# Patient Record
Sex: Female | Born: 1990
Health system: Southern US, Community
[De-identification: ages and names within clinical notes are randomized; demographics above are authoritative.]

## PROBLEM LIST (undated history)

## (undated) ENCOUNTER — Inpatient Hospital Stay (HOSPITAL_COMMUNITY): Payer: Self-pay

## (undated) ENCOUNTER — Inpatient Hospital Stay (HOSPITAL_COMMUNITY): Payer: 59

## (undated) DIAGNOSIS — T8859XA Other complications of anesthesia, initial encounter: Secondary | ICD-10-CM

## (undated) DIAGNOSIS — O99019 Anemia complicating pregnancy, unspecified trimester: Secondary | ICD-10-CM

## (undated) DIAGNOSIS — R87810 Cervical high risk human papillomavirus (HPV) DNA test positive: Secondary | ICD-10-CM

## (undated) DIAGNOSIS — IMO0002 Reserved for concepts with insufficient information to code with codable children: Secondary | ICD-10-CM

## (undated) DIAGNOSIS — R87610 Atypical squamous cells of undetermined significance on cytologic smear of cervix (ASC-US): Secondary | ICD-10-CM

## (undated) DIAGNOSIS — T4145XA Adverse effect of unspecified anesthetic, initial encounter: Secondary | ICD-10-CM

## (undated) DIAGNOSIS — I10 Essential (primary) hypertension: Secondary | ICD-10-CM

## (undated) DIAGNOSIS — Z8759 Personal history of other complications of pregnancy, childbirth and the puerperium: Secondary | ICD-10-CM

## (undated) DIAGNOSIS — J45909 Unspecified asthma, uncomplicated: Secondary | ICD-10-CM

## (undated) HISTORY — DX: Adverse effect of unspecified anesthetic, initial encounter: T41.45XA

## (undated) HISTORY — PX: DILATION AND CURETTAGE OF UTERUS: SHX78

## (undated) HISTORY — PX: INDUCED ABORTION: SHX677

## (undated) HISTORY — DX: Anemia complicating pregnancy, unspecified trimester: O99.019

## (undated) HISTORY — DX: Other complications of anesthesia, initial encounter: T88.59XA

## (undated) HISTORY — PX: WISDOM TOOTH EXTRACTION: SHX21

## (undated) HISTORY — DX: Atypical squamous cells of undetermined significance on cytologic smear of cervix (ASC-US): R87.610

## (undated) HISTORY — DX: Personal history of other complications of pregnancy, childbirth and the puerperium: Z87.59

## (undated) HISTORY — DX: Cervical high risk human papillomavirus (HPV) DNA test positive: R87.810

---
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
## 2013-05-16 ENCOUNTER — Encounter: Payer: 59 | Admitting: Nurse Practitioner

## 2013-05-16 ENCOUNTER — Encounter: Payer: Self-pay | Admitting: Nurse Practitioner

## 2013-05-17 NOTE — Progress Notes (Signed)
Patient was not seen today by practitioner.

## 2013-06-29 ENCOUNTER — Inpatient Hospital Stay (HOSPITAL_COMMUNITY): Payer: 59

## 2013-06-29 ENCOUNTER — Encounter (HOSPITAL_COMMUNITY): Payer: Self-pay | Admitting: *Deleted

## 2013-06-29 ENCOUNTER — Inpatient Hospital Stay (HOSPITAL_COMMUNITY)
Admission: AD | Admit: 2013-06-29 | Discharge: 2013-06-29 | Disposition: A | Discharge status: Home / Self Care | Payer: 59 | Source: Ambulatory Visit | Attending: Obstetrics & Gynecology | Admitting: Obstetrics & Gynecology

## 2013-06-29 DIAGNOSIS — R1031 Right lower quadrant pain: Secondary | ICD-10-CM | POA: Insufficient documentation

## 2013-06-29 DIAGNOSIS — IMO0001 Reserved for inherently not codable concepts without codable children: Secondary | ICD-10-CM | POA: Insufficient documentation

## 2013-06-29 DIAGNOSIS — M7918 Myalgia, other site: Secondary | ICD-10-CM

## 2013-06-29 DIAGNOSIS — J9819 Other pulmonary collapse: Secondary | ICD-10-CM | POA: Insufficient documentation

## 2013-06-29 DIAGNOSIS — F17200 Nicotine dependence, unspecified, uncomplicated: Secondary | ICD-10-CM | POA: Insufficient documentation

## 2013-06-29 DIAGNOSIS — Z30431 Encounter for routine checking of intrauterine contraceptive device: Secondary | ICD-10-CM | POA: Insufficient documentation

## 2013-06-29 DIAGNOSIS — K8020 Calculus of gallbladder without cholecystitis without obstruction: Secondary | ICD-10-CM | POA: Insufficient documentation

## 2013-06-29 LAB — CBC
Hemoglobin: 14.5 g/dL (ref 12.0–15.0)
RBC: 4.7 MIL/uL (ref 3.87–5.11)

## 2013-06-29 LAB — POCT PREGNANCY, URINE: Preg Test, Ur: NEGATIVE

## 2013-06-29 LAB — WET PREP, GENITAL
Clue Cells Wet Prep HPF POC: NONE SEEN
Trich, Wet Prep: NONE SEEN

## 2013-06-29 LAB — URINALYSIS, ROUTINE W REFLEX MICROSCOPIC
Leukocytes, UA: NEGATIVE
Nitrite: NEGATIVE
Specific Gravity, Urine: >1.03 — ABNORMAL HIGH (ref 1.005–1.030)
pH: 6 (ref 5.0–8.0)

## 2013-06-29 MED ORDER — PROMETHAZINE HCL 25 MG PO TABS: 12.5000 mg | ORAL_TABLET | Freq: Four times a day (QID) | ORAL | Status: DC | PRN

## 2013-06-29 MED ORDER — CYCLOBENZAPRINE HCL 10 MG PO TABS
10.0000 mg | ORAL_TABLET | ORAL | Status: AC
  Administered 2013-06-29: 10 mg via ORAL
  Filled 2013-06-29: qty 1

## 2013-06-29 MED ORDER — CYCLOBENZAPRINE HCL 10 MG PO TABS: 10.0000 mg | ORAL_TABLET | Freq: Three times a day (TID) | ORAL | Status: DC | PRN

## 2013-06-29 MED ORDER — HYDROMORPHONE HCL PF 2 MG/ML IJ SOLN
2.0000 mg | INTRAMUSCULAR | Status: DC
  Filled 2013-06-29: qty 1

## 2013-06-29 MED ORDER — DIPHENHYDRAMINE HCL 50 MG/ML IJ SOLN
25.0000 mg | INTRAMUSCULAR | Status: AC
  Administered 2013-06-29: 25 mg via INTRAVENOUS
  Filled 2013-06-29: qty 1

## 2013-06-29 MED ORDER — HYDROMORPHONE HCL PF 2 MG/ML IJ SOLN
2.0000 mg | INTRAMUSCULAR | Status: AC
  Administered 2013-06-29: 2 mg via INTRAMUSCULAR

## 2013-06-29 MED ORDER — IBUPROFEN 800 MG PO TABS: 800.0000 mg | ORAL_TABLET | Freq: Three times a day (TID) | ORAL | Status: DC | PRN

## 2013-06-29 MED ORDER — IOHEXOL 300 MG/ML  SOLN
100.0000 mL | Freq: Once | INTRAMUSCULAR | Status: AC | PRN
Start: 2013-06-29 — End: 2013-06-29
  Administered 2013-06-29: 100 mL via INTRAVENOUS

## 2013-06-29 MED ORDER — IOHEXOL 300 MG/ML  SOLN
50.0000 mL | INTRAMUSCULAR | Status: AC
  Administered 2013-06-29: 50 mL via ORAL

## 2013-06-29 NOTE — MAU Note (Signed)
Has mirena, doesn't ever get a period, just spots.  Don't ever feel anything, can't even tell it's there.  Spotting started on Oct 12, severe pain started on Monday in RLQ- is getting worse, tender to touch, can't sleep. Had a fever last night. (100.3), has been throwing up.

## 2013-06-29 NOTE — Progress Notes (Signed)
Pt states she has been sitting on the toilet all day. Pt states she has sat on the toilet for an hour at times

## 2013-06-29 NOTE — MAU Provider Note (Signed)
History    Sydney Henry is a 22 y.o. G2P1011 complaining of 1+ week of abdominal pain that worsened drastically today. It is accompanied by nausea and vomiting; the patient has been able to "hold down" ginger ale PO since about 1200 hrs. The patient's pain started midline / periumbilical and has progressed to the RLQ and she indicates the pain is in the region of McBurney's point when pointing. The patient is exquisitely tender and likens her pain to "the contractions of labor and unlike anything else (she) has ever experienced." The patient says the pain sometimes radiates to her back when she puts pressure on the area or positions herself wrong. The pain is not relieved with position. The patient reports pain is refractory to 800mg  ibuprofen OTC. She states she "had a fever of 103" at home Monday. The patient also endorses "spotting for over a month with no regular period" which is a change. She had a Mirena IUD placed 09/2012.   CSN: [id number]  Arrival date and time: 06/29/13 1541   None     Chief Complaint  Patient presents with  . Pelvic Pain   HPI  OB History   Grav Para Term Preterm Abortions TAB SAB Ect Mult Living   2 1 1  1 1    1       Past Medical History  Diagnosis Date  . Anemia   . Hypertension     2013    Past Surgical History  Procedure Laterality Date  . Induced abortion    . Dilation and curettage of uterus      Family History  Problem Relation Age of Onset  . Hypertension Father   . Diabetes Father   . Diabetes Maternal Aunt   . Hypertension Maternal Aunt   . Cancer Maternal Grandmother   . Diabetes Maternal Grandfather   . Diabetes Paternal Grandmother   . Hypertension Paternal Grandmother   . Cancer Paternal Grandmother     History  Substance Use Topics  . Smoking status: Current Every Day Smoker -- 0.25 packs/day for .5 years    Types: Cigarettes    Last Attempt to Quit: 02/23/2011  . Smokeless tobacco: Never Used  . Alcohol Use: No     Allergies: No Known Allergies  Prescriptions prior to admission  Medication Sig Dispense Refill  . hydrochlorothiazide (HYDRODIURIL) 25 MG tablet Take 1 tablet (25 mg total) by mouth daily.  30 tablet  1  . labetalol (NORMODYNE) 200 MG tablet Take 1 tablet (200 mg total) by mouth 2 (two) times daily.  60 tablet  1    Review of Systems  Constitutional: Positive for fever (PT 99.40F here; claims 103 @ home [place]). Negative for chills and weight loss.  HENT: Negative.   Eyes: Negative.  Negative for blurred vision and double vision.  Respiratory: Negative.  Negative for shortness of breath and wheezing.   Cardiovascular: Negative.  Negative for chest pain, palpitations and orthopnea.  Gastrointestinal: Positive for nausea, vomiting (see HPI; nausea without vomiting currently. Able to tolerate ginger ale PO immediately prior to arrival.) and abdominal pain (RLQ; pt endorses it radiates to superior lumbar/inferior thoracic area ). Negative for diarrhea, constipation, blood in stool and melena.  Genitourinary: Negative.   Musculoskeletal: Positive for back pain (pt endorses hx of lumbosarcral back pain "since her epidural").  Skin: Negative.  Negative for itching and rash.  Neurological: Negative.  Negative for dizziness and focal weakness.  Endo/Heme/Allergies: Negative.   Psychiatric/Behavioral: Negative.  Physical Exam   Blood pressure 123/77, pulse 101, temperature 99.7 F (37.6 C), temperature source Oral, resp. rate 18, height 5\' 8"  (1.727 m), weight 78.926 kg (174 lb), SpO2 99.00%.  Physical Exam  Constitutional: She is oriented to person, place, and time. She appears well-developed and well-nourished. She appears distressed.  Patient tearing / trying not to cry and obviously in pain on initial presentation. Patient is well groomed, cordial, and seems reliable as a historian.  HENT:  Head: Normocephalic and atraumatic.  Right Ear: External ear normal.  Left Ear: External ear  normal.  Eyes: Pupils are equal, round, and reactive to light. Right eye exhibits no discharge. Left eye exhibits no discharge.  Neck: No JVD present. No tracheal deviation present.  Cardiovascular: Normal rate, normal heart sounds and intact distal pulses.  Exam reveals no gallop and no friction rub.   No murmur heard. Respiratory: Effort normal and breath sounds normal. No stridor. No respiratory distress. She has no wheezes. She has no rales. She exhibits no tenderness.  GI: She exhibits no mass. There is tenderness (RLQ; exquisitely tender - seems to localize classically at McBurney's point. ). There is rebound (Patient says she feels a "snap" of pain on rebound, but the pain of pressing is so bad that "it is difficult to tell if it gets worse or not") and guarding.  Musculoskeletal: She exhibits no edema and no tenderness.  Neurological: She is alert and oriented to person, place, and time. She has normal reflexes.  Skin: Skin is warm and dry. No rash noted. She is not diaphoretic. No erythema. No pallor.  Psychiatric: She has a normal mood and affect. Her behavior is normal. Judgment and thought content normal.   Pelvic exam: Cervix pink, visually closed, without lesion, scant white creamy discharge, Mirena string NOT visible, unable to locate in os with cotton swab, vaginal walls and external genitalia normal Bimanual exam: Cervix 0/long/high, firm, anterior, neg CMT, generalized tenderness in RLQ of abdomen/pelvis, no palpable mass  Results for orders placed during the hospital encounter of 06/29/13 (from the past 24 hour(s))  URINALYSIS, ROUTINE W REFLEX MICROSCOPIC     Status: Abnormal   Collection Time    06/29/13  4:19 PM      Result Value Range   Color, Urine YELLOW  YELLOW   APPearance CLEAR  CLEAR   Specific Gravity, Urine >1.030 (*) 1.005 - 1.030   pH 6.0  5.0 - 8.0   Glucose, UA NEGATIVE  NEGATIVE mg/dL   Hgb urine dipstick NEGATIVE  NEGATIVE   Bilirubin Urine NEGATIVE   NEGATIVE   Ketones, ur NEGATIVE  NEGATIVE mg/dL   Protein, ur NEGATIVE  NEGATIVE mg/dL   Urobilinogen, UA 0.2  0.0 - 1.0 mg/dL   Nitrite NEGATIVE  NEGATIVE   Leukocytes, UA NEGATIVE  NEGATIVE  CBC     Status: None   Collection Time    06/29/13  4:33 PM      Result Value Range   WBC 6.2  4.0 - 10.5 K/uL   RBC 4.70  3.87 - 5.11 MIL/uL   Hemoglobin 14.5  12.0 - 15.0 g/dL   HCT 04.5  40.9 - 81.1 %   MCV 88.1  78.0 - 100.0 fL   MCH 30.9  26.0 - 34.0 pg   MCHC 35.0  30.0 - 36.0 g/dL   RDW 91.4  78.2 - 95.6 %   Platelets 181  150 - 400 K/uL  POCT PREGNANCY, URINE  Status: None   Collection Time    06/29/13  4:37 PM      Result Value Range   Preg Test, Ur NEGATIVE  NEGATIVE   MAU Course  Procedures  MDM Dilaudid 2 mg IM x1 dose in MAU--complete relief of pt pain  Assessment and Plan    Jefm Petty 06/29/2013, 4:33 PM   I have seen this patient and agree with the above PA student's note with the following additions.  LEFTWICH-KIRBY, LISA Certified Nurse-Midwife  Ct Abdomen Pelvis W Contrast  06/29/2013   CLINICAL DATA:  Progressive right lower quadrant pain, IUD.  EXAM: CT ABDOMEN AND PELVIS WITH CONTRAST  TECHNIQUE: Multidetector CT imaging of the abdomen and pelvis was performed using the standard protocol following bolus administration of intravenous contrast.  CONTRAST:  OMNIPAQUE IOHEXOL 300 MG/ML  SOLN  COMPARISON:  None.  FINDINGS: Minor left base atelectasis. Right lower lobe clear. No lower lobe pneumonia, collapse or consolidation. Normal heart size. No pericardial or pleural effusion.  Abdomen: gallbladder contains punctate tiny calcified gallstones, images 25 and 31.  Liver, biliary system, pancreas, spleen, adrenal glands, and kidneys are within normal limits for age and demonstrate no acute process. Patent portal vein.  Negative for bowel obstruction, dilatation, ileus, or free air.  No abdominal free fluid, fluid collection, hemorrhage, abscess, or  adenopathy.  Negative for aneurysm or vascular abnormality.  Pelvis: normal appendix demonstrated. IUD within the endometrial cavity in the midline. No pelvic free fluid, fluid collection, hemorrhage, abscess, adenopathy, inguinal abnormality, or hernia.  No acute osseous finding.  IMPRESSION: IUD within the endometrial cavity in the pelvic midline.  Normal appendix demonstrated  No acute intra-abdominal or pelvic finding  Incidental cholelithiasis   Electronically Signed   By: Ruel Favors M.D.   On: 06/29/2013 19:14   A: 1. Right lower quadrant abdominal pain    Initially differential included appendicitis, r/o by CT scan.  Following CT scan, pt tender to palpation in RLQ and into R groin, no rebound tenderness.  Likely musculoskeletal pain, groin injury.   Pt crying in pain 1 hour after Dilaudid dose.  Flexeril 10 mg PO in MAU.  Pt vomited x3 in MAU, continues to report significant pain following medication administration.   U/S ordered to evaluate for torsion  P: U/S to evaluate for torsion pending Ibuprofen 800 mg Q 8 hours prescription renewed Phenergan 12.5-25 mg PO Q 6 hours PRN Percocet 5/325 Q 1-2 hours x15 tabs Report to [place], CNM  Sharen Counter Certified Nurse-Midwife  Pain resolved w/ flexeril.   Assessment: 1. Right lower quadrant abdominal pain   2. Musculoskeletal pain    Plan: D/C home in stable condition.  Comfort measures. Appendicitis precautions reviewed.    Medication List         cyclobenzaprine 10 MG tablet  Commonly known as:  FLEXERIL  Take 1 tablet (10 mg total) by mouth 3 (three) times daily as needed for muscle spasms.     hydrochlorothiazide 25 MG tablet  Commonly known as:  HYDRODIURIL  Take 1 tablet (25 mg total) by mouth daily.     ibuprofen 800 MG tablet  Commonly known as:  ADVIL,MOTRIN  Take 1 tablet (800 mg total) by mouth every 8 (eight) hours as needed (stomach pain).     promethazine 25 MG tablet  Commonly known  as:  PHENERGAN  Take 0.5-1 tablets (12.5-25 mg total) by mouth every 6 (six) hours as needed for nausea or vomiting.  Follow-up Information   Follow up with primary care provider. (As needed)       Follow up with [facility]. (As needed if symptoms worsen)    Contact information:   [address] [place] [address]      [place], [place] 06/29/2013 11:45 PM

## 2013-06-29 NOTE — MAU Note (Signed)
Pt states she has been having pain on her right side for 3wks and it has continued to get worse

## 2013-06-29 NOTE — MAU Note (Signed)
Pt offered wheelchair to be taken to car. Pt declined.

## 2013-06-29 NOTE — Progress Notes (Signed)
Pt describes the pain as shooting pain to her side and her back

## 2013-06-30 LAB — GC/CHLAMYDIA PROBE AMP
CT Probe RNA: POSITIVE — AB
GC Probe RNA: NEGATIVE

## 2013-07-01 ENCOUNTER — Ambulatory Visit (INDEPENDENT_AMBULATORY_CARE_PROVIDER_SITE_OTHER): Payer: 59 | Admitting: *Deleted

## 2013-07-01 VITALS — BP 122/80 | HR 68 | Temp 97.9°F

## 2013-07-01 DIAGNOSIS — A749 Chlamydial infection, unspecified: Secondary | ICD-10-CM

## 2013-07-01 MED ORDER — AZITHROMYCIN 250 MG PO TABS: ORAL_TABLET | ORAL | Status: DC

## 2013-07-01 MED ORDER — AZITHROMYCIN 1 G PO PACK
1.0000 g | PACK | Freq: Once | ORAL | Status: AC
  Administered 2013-07-01: 1 g via ORAL

## 2013-07-01 NOTE — Progress Notes (Signed)
Patient came to clinic for treatment chlamydia.  Given Zithromax. Called back within a few minutes and states she threw up her medicine. Per Joseph Berkshire, PA may sent refill to her pharmacy.

## 2014-03-20 ENCOUNTER — Telehealth: Payer: Self-pay

## 2014-03-20 ENCOUNTER — Ambulatory Visit: Payer: 59 | Admitting: Obstetrics & Gynecology

## 2014-03-20 NOTE — Telephone Encounter (Signed)
Patient missed today's appointment for annual. Called patient who states her phone died when she was at another appointment and couldn't call. Patient would like to reschedule. Informed patient I would give her information to front office staff and they will call her with an appointment. Patient verbalized understanding and gratitude.

## 2014-03-21 ENCOUNTER — Encounter: Payer: Self-pay | Admitting: Obstetrics & Gynecology

## 2014-05-15 ENCOUNTER — Ambulatory Visit: Payer: 59 | Admitting: Obstetrics & Gynecology

## 2014-06-26 ENCOUNTER — Ambulatory Visit: Payer: 59 | Admitting: Obstetrics and Gynecology

## 2014-06-26 ENCOUNTER — Encounter (HOSPITAL_COMMUNITY): Payer: Self-pay | Admitting: *Deleted

## 2014-09-25 ENCOUNTER — Inpatient Hospital Stay (HOSPITAL_COMMUNITY): Payer: 59

## 2014-09-25 ENCOUNTER — Ambulatory Visit: Payer: 59 | Admitting: Family Medicine

## 2014-09-25 ENCOUNTER — Encounter (HOSPITAL_COMMUNITY): Payer: Self-pay | Admitting: *Deleted

## 2014-09-25 ENCOUNTER — Inpatient Hospital Stay (HOSPITAL_COMMUNITY)
Admission: AD | Admit: 2014-09-25 | Discharge: 2014-09-25 | Disposition: A | Discharge status: Home / Self Care | Payer: 59 | Source: Ambulatory Visit | Attending: Obstetrics and Gynecology | Admitting: Obstetrics and Gynecology

## 2014-09-25 DIAGNOSIS — F17210 Nicotine dependence, cigarettes, uncomplicated: Secondary | ICD-10-CM | POA: Insufficient documentation

## 2014-09-25 DIAGNOSIS — R109 Unspecified abdominal pain: Secondary | ICD-10-CM | POA: Diagnosis present

## 2014-09-25 DIAGNOSIS — N938 Other specified abnormal uterine and vaginal bleeding: Secondary | ICD-10-CM | POA: Diagnosis not present

## 2014-09-25 DIAGNOSIS — Z975 Presence of (intrauterine) contraceptive device: Secondary | ICD-10-CM | POA: Diagnosis not present

## 2014-09-25 DIAGNOSIS — R58 Hemorrhage, not elsewhere classified: Secondary | ICD-10-CM

## 2014-09-25 LAB — URINALYSIS, ROUTINE W REFLEX MICROSCOPIC
Bilirubin Urine: NEGATIVE
Glucose, UA: NEGATIVE mg/dL
Hgb urine dipstick: NEGATIVE
KETONES UR: NEGATIVE mg/dL
Leukocytes, UA: NEGATIVE
NITRITE: NEGATIVE
Protein, ur: NEGATIVE mg/dL
Specific Gravity, Urine: 1.02 (ref 1.005–1.030)
Urobilinogen, UA: 0.2 mg/dL (ref 0.0–1.0)
pH: 7 (ref 5.0–8.0)

## 2014-09-25 LAB — CBC
HEMATOCRIT: 40.1 % (ref 36.0–46.0)
Hemoglobin: 13.8 g/dL (ref 12.0–15.0)
MCH: 31.7 pg (ref 26.0–34.0)
MCHC: 34.4 g/dL (ref 30.0–36.0)
MCV: 92 fL (ref 78.0–100.0)
Platelets: 174 10*3/uL (ref 150–400)
RBC: 4.36 MIL/uL (ref 3.87–5.11)
RDW: 12.2 % (ref 11.5–15.5)
WBC: 5.8 10*3/uL (ref 4.0–10.5)

## 2014-09-25 LAB — POCT PREGNANCY, URINE: Preg Test, Ur: NEGATIVE

## 2014-09-25 LAB — WET PREP, GENITAL
Trich, Wet Prep: NONE SEEN
Yeast Wet Prep HPF POC: NONE SEEN

## 2014-09-25 MED ORDER — MEGESTROL ACETATE 40 MG PO TABS: 40.0000 mg | ORAL_TABLET | Freq: Every day | ORAL | Status: DC

## 2014-09-25 MED ORDER — NAPROXEN 500 MG PO TBEC: 500.0000 mg | DELAYED_RELEASE_TABLET | Freq: Two times a day (BID) | ORAL | Status: DC

## 2014-09-25 NOTE — MAU Note (Signed)
Pt presents complaining of 2-3 menstrual cycles a month with cramping. States she had an appointment in the clinic today but didn't make it.

## 2014-09-25 NOTE — Discharge Instructions (Signed)
Levonorgestrel intrauterine device (IUD) What is this medicine? LEVONORGESTREL IUD (LEE voe nor jes trel) is a contraceptive (birth control) device. The device is placed inside the uterus by a healthcare professional. It is used to prevent pregnancy and can also be used to treat heavy bleeding that occurs during your period. Depending on the device, it can be used for 3 to 5 years. This medicine may be used for other purposes; ask your health care provider or pharmacist if you have questions. COMMON BRAND NAME(S): LILETTA, Mirena, Skyla What should I tell my health care provider before I take this medicine? They need to know if you have any of these conditions: -abnormal Pap smear -cancer of the breast, uterus, or cervix -diabetes -endometritis -genital or pelvic infection now or in the past -have more than one sexual partner or your partner has more than one partner -heart disease -history of an ectopic or tubal pregnancy -immune system problems -IUD in place -liver disease or tumor -problems with blood clots or take blood-thinners -use intravenous drugs -uterus of unusual shape -vaginal bleeding that has not been explained -an unusual or allergic reaction to levonorgestrel, other hormones, silicone, or polyethylene, medicines, foods, dyes, or preservatives -pregnant or trying to get pregnant -breast-feeding How should I use this medicine? This device is placed inside the uterus by a health care professional. Talk to your pediatrician regarding the use of this medicine in children. Special care may be needed. Overdosage: If you think you have taken too much of this medicine contact a poison control center or emergency room at once. NOTE: This medicine is only for you. Do not share this medicine with others. What if I miss a dose? This does not apply. What may interact with this medicine? Do not take this medicine with any of the following  medications: -amprenavir -bosentan -fosamprenavir This medicine may also interact with the following medications: -aprepitant -barbiturate medicines for inducing sleep or treating seizures -bexarotene -griseofulvin -medicines to treat seizures like carbamazepine, ethotoin, felbamate, oxcarbazepine, phenytoin, topiramate -modafinil -pioglitazone -rifabutin -rifampin -rifapentine -some medicines to treat HIV infection like atazanavir, indinavir, lopinavir, nelfinavir, tipranavir, ritonavir -St. John's wort -warfarin This list may not describe all possible interactions. Give your health care provider a list of all the medicines, herbs, non-prescription drugs, or dietary supplements you use. Also tell them if you smoke, drink alcohol, or use illegal drugs. Some items may interact with your medicine. What should I watch for while using this medicine? Visit your doctor or health care professional for regular check ups. See your doctor if you or your partner has sexual contact with others, becomes HIV positive, or gets a sexual transmitted disease. This product does not protect you against HIV infection (AIDS) or other sexually transmitted diseases. You can check the placement of the IUD yourself by reaching up to the top of your vagina with clean fingers to feel the threads. Do not pull on the threads. It is a good habit to check placement after each menstrual period. Call your doctor right away if you feel more of the IUD than just the threads or if you cannot feel the threads at all. The IUD may come out by itself. You may become pregnant if the device comes out. If you notice that the IUD has come out use a backup birth control method like condoms and call your health care provider. Using tampons will not change the position of the IUD and are okay to use during your period. What side effects may   I notice from receiving this medicine? Side effects that you should report to your doctor or  health care professional as soon as possible: -allergic reactions like skin rash, itching or hives, swelling of the face, lips, or tongue -fever, flu-like symptoms -genital sores -high blood pressure -no menstrual period for 6 weeks during use -pain, swelling, warmth in the leg -pelvic pain or tenderness -severe or sudden headache -signs of pregnancy -stomach cramping -sudden shortness of breath -trouble with balance, talking, or walking -unusual vaginal bleeding, discharge -yellowing of the eyes or skin Side effects that usually do not require medical attention (report to your doctor or health care professional if they continue or are bothersome): -acne -breast pain -change in sex drive or performance -changes in weight -cramping, dizziness, or faintness while the device is being inserted -headache -irregular menstrual bleeding within first 3 to 6 months of use -nausea This list may not describe all possible side effects. Call your doctor for medical advice about side effects. You may report side effects to FDA at 1-800-FDA-1088. Where should I keep my medicine? This does not apply. NOTE: This sheet is a summary. It may not cover all possible information. If you have questions about this medicine, talk to your doctor, pharmacist, or health care provider.  2015, [employer]. (2011-09-11 13:54:04)  

## 2014-09-25 NOTE — MAU Note (Signed)
Pt has heavy bleeding and discharge on and off.  Abdominal pain with bleeding. Pt has Mirena insert.

## 2014-09-25 NOTE — MAU Provider Note (Signed)
First Provider Initiated Contact with Patient 09/25/14 1900      Chief Complaint:Patient's last menstrual period was 09/10/2013. Pt presents complaining of 2-3 menstrual cycles a month with cramping. States she had an appointment in the clinic today but didn't make it. Has had Mirena for 3 years.  Had usually only spotted once a month, but since October has had bleeding 2-3 times a month for 5 days. Stopped bleeding on Friday. Has noticed increase in white discharge over the past few days.   Sydney Henry is  24 y.o. [id number].    Past Medical History  Diagnosis Date  . Anemia   . Hypertension     Past Surgical History  Procedure Laterality Date  . Induced abortion    . Dilation and curettage of uterus      Family History  Problem Relation Age of Onset  . Hypertension Father   . Diabetes Father   . Diabetes Maternal Aunt   . Hypertension Maternal Aunt   . Cancer Maternal Grandmother   . Diabetes Maternal Grandfather   . Diabetes Paternal Grandmother   . Hypertension Paternal Grandmother   . Cancer Paternal Grandmother     History  Substance Use Topics  . Smoking status: Current Every Day Smoker -- 0.25 packs/day for .5 years    Types: Cigarettes    Last Attempt to Quit: 02/23/2011  . Smokeless tobacco: Never Used  . Alcohol Use: 0.6 oz/week    1 Cans of beer per week    Allergies: No Known Allergies  Prescriptions prior to admission  Medication Sig Dispense Refill Last Dose  . ibuprofen (ADVIL,MOTRIN) 200 MG tablet Take 400 mg by mouth every 6 (six) hours as needed for moderate pain (Used fr back & abdominal pain.).   Past Month at Unknown time  . traMADol (ULTRAM) 50 MG tablet Take 100 mg by mouth once.     Marland Kitchen azithromycin (ZITHROMAX) 250 MG tablet Take all 4 tablets all at once with a meal. Do not take on an empty stomach (Patient not taking: Reported on 09/25/2014) 4 each 0 Not Taking at Unknown time  . cyclobenzaprine (FLEXERIL) 10 MG tablet Take 1 tablet (10 mg total)  by mouth 3 (three) times daily as needed for muscle spasms. (Patient not taking: Reported on 09/25/2014) 30 tablet 0 Not Taking at Unknown time  . hydrochlorothiazide (HYDRODIURIL) 25 MG tablet Take 1 tablet (25 mg total) by mouth daily. 30 tablet 1 Taking  . ibuprofen (ADVIL,MOTRIN) 800 MG tablet Take 1 tablet (800 mg total) by mouth every 8 (eight) hours as needed (stomach pain). (Patient not taking: Reported on 09/25/2014) 30 tablet 0 Not Taking at Unknown time  . promethazine (PHENERGAN) 25 MG tablet Take 0.5-1 tablets (12.5-25 mg total) by mouth every 6 (six) hours as needed for nausea or vomiting. (Patient not taking: Reported on 09/25/2014) 30 tablet 0 Not Taking at Unknown time     Review of Systems   Constitutional: Negative for fever and chills Eyes: Negative for visual disturbances Respiratory: Negative for shortness of breath, dyspnea Cardiovascular: Negative for chest pain or palpitations  Gastrointestinal: Negative for vomiting, diarrhea and constipation Genitourinary: Negative for dysuria and urgency Musculoskeletal: Negative for back pain, joint pain, myalgias  Neurological: Negative for dizziness and headaches     Physical Exam   Blood pressure 128/96, pulse 66, temperature 99.2 F (37.3 C), temperature source Oral, resp. rate 18, height  (1.727 m), weight 70.761 kg (156 lb), last menstrual period 09/10/2013, SpO2  100 %.  General: General appearance - alert, well appearing, and in no distress Chest - clear to auscultation, no wheezes, rales or rhonchi, symmetric air entry Heart - normal rate and regular rhythm Abdomen - soft, nontender, nondistended, no masses or organomegaly Pelvic - normal external genitalia, vulva, vagina, cervix, uterus and adnexa.  Normal appearing  white discharge.  IUD strings not visible Extremities - no pedal edema noted   Labs: Results for orders placed or performed during the hospital encounter of 09/25/14 (from the past 24 hour(s))   Urinalysis, Routine w reflex microscopic   Collection Time: 09/25/14  4:49 PM  Result Value Ref Range   Color, Urine YELLOW YELLOW   APPearance CLEAR CLEAR   Specific Gravity, Urine 1.020 1.005 - 1.030   pH 7.0 5.0 - 8.0   Glucose, UA NEGATIVE NEGATIVE mg/dL   Hgb urine dipstick NEGATIVE NEGATIVE   Bilirubin Urine NEGATIVE NEGATIVE   Ketones, ur NEGATIVE NEGATIVE mg/dL   Protein, ur NEGATIVE NEGATIVE mg/dL   Urobilinogen, UA 0.2 0.0 - 1.0 mg/dL   Nitrite NEGATIVE NEGATIVE   Leukocytes, UA NEGATIVE NEGATIVE  Pregnancy, urine POC   Collection Time: 09/25/14  4:59 PM  Result Value Ref Range   Preg Test, Ur NEGATIVE NEGATIVE  Wet prep, genital   Collection Time: 09/25/14  7:05 PM  Result Value Ref Range   Yeast Wet Prep HPF POC NONE SEEN NONE SEEN   Trich, Wet Prep NONE SEEN NONE SEEN   Clue Cells Wet Prep HPF POC FEW (A) NONE SEEN   WBC, Wet Prep HPF POC FEW (A) NONE SEEN  CBC   Collection Time: 09/25/14  7:20 PM  Result Value Ref Range   WBC 5.8 4.0 - 10.5 K/uL   RBC 4.36 3.87 - 5.11 MIL/uL   Hemoglobin 13.8 12.0 - 15.0 g/dL   HCT 78.2 95.6 - 21.3 %   MCV 92.0 78.0 - 100.0 fL   MCH 31.7 26.0 - 34.0 pg   MCHC 34.4 30.0 - 36.0 g/dL   RDW 08.6 57.8 - 46.9 %   Platelets 174 150 - 400 K/uL   Imaging Studies:  US Transvaginal Non-ob  09/25/2014   CLINICAL DATA:  Abnormal uterine bleeding, cramping, pelvic pain  EXAM: TRANSABDOMINAL AND TRANSVAGINAL ULTRASOUND OF PELVIS  TECHNIQUE: Both transabdominal and transvaginal ultrasound examinations of the pelvis were performed. Transabdominal technique was performed for global imaging of the pelvis including uterus, ovaries, adnexal regions, and pelvic cul-de-sac. It was necessary to proceed with endovaginal exam following the transabdominal exam to visualize the uterus, endometrium, ovaries and adnexa .  COMPARISON:  06/29/2013  FINDINGS: Uterus  Measurements: 8.7 x 3.6 x 5.4 cm. No fibroids or other mass visualized.  Endometrium   Thickness: 4 mm in thickness. IUD in expected position. No focal abnormality visualized.  Right ovary  Measurements: 4.0 x 2.5 x 1.9 cm. Normal appearance/no adnexal mass.  Left ovary  Measurements: 3.1 x 2.3 x 2.3 cm. Normal appearance/no adnexal mass.  Other findings  No free fluid.  IMPRESSION: IUD in expected position.  No focal abnormality or acute findings.   Electronically Signed   By: Charlett Nose M.D.   On: 09/25/2014 20:31   US Pelvis Complete  09/25/2014   CLINICAL DATA:  Abnormal uterine bleeding, cramping, pelvic pain  EXAM: TRANSABDOMINAL AND TRANSVAGINAL ULTRASOUND OF PELVIS  TECHNIQUE: Both transabdominal and transvaginal ultrasound examinations of the pelvis were performed. Transabdominal technique was performed for global imaging of the pelvis including uterus, ovaries,  adnexal regions, and pelvic cul-de-sac. It was necessary to proceed with endovaginal exam following the transabdominal exam to visualize the uterus, endometrium, ovaries and adnexa .  COMPARISON:  06/29/2013  FINDINGS: Uterus  Measurements: 8.7 x 3.6 x 5.4 cm. No fibroids or other mass visualized.  Endometrium  Thickness: 4 mm in thickness. IUD in expected position. No focal abnormality visualized.  Right ovary  Measurements: 4.0 x 2.5 x 1.9 cm. Normal appearance/no adnexal mass.  Left ovary  Measurements: 3.1 x 2.3 x 2.3 cm. Normal appearance/no adnexal mass.  Other findings  No free fluid.  IMPRESSION: IUD in expected position.  No focal abnormality or acute findings.   Electronically Signed   By: Charlett NoseKevin  Dover M.D.   On: 09/25/2014 20:31     Assessment:  DUB on Mirena  Plan: Megace 40mg  qd for 6 weeks.  Increase to 2-3/day if BTB occurs. Has f/u appt with GYN clinic 2/29  CRESENZO-DISHMAN,Betheny Suchecki

## 2014-09-26 LAB — GC/CHLAMYDIA PROBE AMP (~~LOC~~) NOT AT ARMC
Chlamydia: NEGATIVE
Neisseria Gonorrhea: NEGATIVE

## 2014-09-27 LAB — HIV ANTIBODY (ROUTINE TESTING W REFLEX): HIV Screen 4th Generation wRfx: NONREACTIVE

## 2014-10-11 ENCOUNTER — Ambulatory Visit: Payer: 59 | Admitting: Obstetrics & Gynecology

## 2014-10-23 ENCOUNTER — Ambulatory Visit: Payer: 59 | Admitting: Nurse Practitioner

## 2014-12-25 ENCOUNTER — Other Ambulatory Visit: Payer: Self-pay | Admitting: *Deleted

## 2014-12-25 NOTE — Telephone Encounter (Signed)
Pt has never been seen at our office. Can not refill medication per Joellyn HaffKim Booker.

## 2015-01-09 ENCOUNTER — Emergency Department (HOSPITAL_COMMUNITY): Payer: 59

## 2015-01-09 ENCOUNTER — Encounter (HOSPITAL_COMMUNITY): Payer: Self-pay | Admitting: Emergency Medicine

## 2015-01-09 ENCOUNTER — Emergency Department (HOSPITAL_COMMUNITY)
Admission: EM | Admit: 2015-01-09 | Discharge: 2015-01-09 | Disposition: A | Discharge status: Home / Self Care | Payer: 59 | Attending: Emergency Medicine | Admitting: Emergency Medicine

## 2015-01-09 DIAGNOSIS — I10 Essential (primary) hypertension: Secondary | ICD-10-CM | POA: Insufficient documentation

## 2015-01-09 DIAGNOSIS — S60411A Abrasion of left index finger, initial encounter: Secondary | ICD-10-CM | POA: Diagnosis not present

## 2015-01-09 DIAGNOSIS — S63602A Unspecified sprain of left thumb, initial encounter: Secondary | ICD-10-CM | POA: Insufficient documentation

## 2015-01-09 DIAGNOSIS — Z720 Tobacco use: Secondary | ICD-10-CM | POA: Diagnosis not present

## 2015-01-09 DIAGNOSIS — S60415A Abrasion of left ring finger, initial encounter: Secondary | ICD-10-CM | POA: Insufficient documentation

## 2015-01-09 DIAGNOSIS — Y929 Unspecified place or not applicable: Secondary | ICD-10-CM | POA: Insufficient documentation

## 2015-01-09 DIAGNOSIS — Z862 Personal history of diseases of the blood and blood-forming organs and certain disorders involving the immune mechanism: Secondary | ICD-10-CM | POA: Insufficient documentation

## 2015-01-09 DIAGNOSIS — S6992XA Unspecified injury of left wrist, hand and finger(s), initial encounter: Secondary | ICD-10-CM | POA: Diagnosis present

## 2015-01-09 DIAGNOSIS — Y990 Civilian activity done for income or pay: Secondary | ICD-10-CM | POA: Diagnosis not present

## 2015-01-09 DIAGNOSIS — Z791 Long term (current) use of non-steroidal anti-inflammatories (NSAID): Secondary | ICD-10-CM | POA: Diagnosis not present

## 2015-01-09 DIAGNOSIS — Z79899 Other long term (current) drug therapy: Secondary | ICD-10-CM | POA: Diagnosis not present

## 2015-01-09 DIAGNOSIS — S60413A Abrasion of left middle finger, initial encounter: Secondary | ICD-10-CM | POA: Diagnosis not present

## 2015-01-09 DIAGNOSIS — Y939 Activity, unspecified: Secondary | ICD-10-CM | POA: Insufficient documentation

## 2015-01-09 NOTE — ED Notes (Signed)
Keenan BachelorSofia, PA at bedside for evaluation.
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
## 2015-01-09 NOTE — Discharge Instructions (Signed)
Thumb Sprain °Your exam shows you have a sprained thumb. This means the ligaments around the joint have been torn. Thumb sprains usually take 3-6 weeks to heal. However, severe, unstable sprains may need to be fixed surgically. Sometimes a small piece of bone is pulled off by the ligament. If this is not treated properly, a sprained thumb can lead to a painful, weak joint. Treatment helps reduce pain and shortens the period of disability. °The thumb, and often the wrist, must remain splinted for the first 2-4 weeks to protect the joint. Keep your hand elevated and apply ice packs frequently to the injured area (20-30 minutes every 2-3 hours) for the next 2-4 days. This helps reduce swelling and control pain. Pain medicine may also be used for several days. Motion and strengthening exercises may later be prescribed for the joint to return to normal function. Be sure to see your doctor for follow-up because your thumb joint may require further support with splints, bandages or tape. Please see your doctor or go to the emergency room right away if you have increased pain despite proper treatment, or a numb, cold, or pale thumb. °Document Released: 09/18/2004 Document Revised: 11/03/2011 Document Reviewed: 08/12/2008 °ExitCare® Patient Information ©2015 [employer]. This information is not intended to replace advice given to you by your health care provider. Make sure you discuss any questions you have with your health care provider. ° °
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
## 2015-01-11 ENCOUNTER — Other Ambulatory Visit: Payer: Self-pay | Admitting: *Deleted

## 2015-01-11 MED ORDER — MEGESTROL ACETATE 40 MG PO TABS: 40.0000 mg | ORAL_TABLET | Freq: Every day | ORAL | Status: DC

## 2015-01-17 ENCOUNTER — Encounter: Payer: Self-pay | Admitting: Obstetrics & Gynecology

## 2015-01-17 ENCOUNTER — Ambulatory Visit (INDEPENDENT_AMBULATORY_CARE_PROVIDER_SITE_OTHER): Payer: 59 | Admitting: Obstetrics & Gynecology

## 2015-01-17 VITALS — BP 142/89 | HR 80 | Temp 98.0°F | Wt 152.0 lb

## 2015-01-17 DIAGNOSIS — N921 Excessive and frequent menstruation with irregular cycle: Secondary | ICD-10-CM | POA: Diagnosis not present

## 2015-01-17 DIAGNOSIS — Z975 Presence of (intrauterine) contraceptive device: Secondary | ICD-10-CM

## 2015-01-17 MED ORDER — IBUPROFEN 800 MG PO TABS
800.0000 mg | ORAL_TABLET | Freq: Once | ORAL | Status: AC
  Administered 2015-01-17: 800 mg via ORAL

## 2015-01-17 NOTE — Progress Notes (Signed)
Patient here today for IUD removal-- has had in for 3 years and been experiencing heavy bleeding and pain since November 2015.

## 2015-01-17 NOTE — Progress Notes (Signed)
   Subjective:    Patient ID: Sydney RobinsAlika D Henry, female    DOB: 06-03-1991, 24 y.o.   MRN: [id number]  HPI  24 yo S AA P1 here because she would like her 24 yo Mirena removed. She has been having irregular bleeding and she is quite through with this method. She is not interested in adding ERT/OCPs to try to control the bleeding. [facility]. She plans to use condoms after removal of Mirena.  Review of Systems     Objective:   Physical Exam  WNWHBFNAD Breathing and ambulating normally Abd- benign I visualized her cervix and did not see her strings. She then recalled that she knew this already. I attempted with multiple instruments to find/grab the IUD. I did not succeed. I then did a bedside u/s and saw the IUD in the uterus.      Assessment & Plan:  Retained IUD- schedule removal in the OR

## 2015-01-17 NOTE — Addendum Note (Signed)
Addended by: Kathee DeltonHILLMAN, Illa Enlow L on: 01/17/2015 03:13 PM   Modules accepted: Orders

## 2015-01-18 ENCOUNTER — Encounter (HOSPITAL_COMMUNITY): Payer: Self-pay | Admitting: *Deleted

## 2015-01-19 ENCOUNTER — Encounter (HOSPITAL_COMMUNITY): Payer: Self-pay | Admitting: General Practice

## 2015-01-31 ENCOUNTER — Ambulatory Visit (HOSPITAL_COMMUNITY): Payer: 59 | Admitting: Anesthesiology

## 2015-01-31 ENCOUNTER — Ambulatory Visit (HOSPITAL_COMMUNITY)
Admission: RE | Admit: 2015-01-31 | Discharge: 2015-01-31 | Disposition: A | Discharge status: Home / Self Care | Payer: 59 | Source: Ambulatory Visit | Attending: Obstetrics & Gynecology | Admitting: Obstetrics & Gynecology

## 2015-01-31 ENCOUNTER — Encounter (HOSPITAL_COMMUNITY): Payer: Self-pay | Admitting: *Deleted

## 2015-01-31 ENCOUNTER — Encounter (HOSPITAL_COMMUNITY)
Admission: RE | Disposition: A | Discharge status: Home / Self Care | Payer: Self-pay | Source: Ambulatory Visit | Attending: Obstetrics & Gynecology

## 2015-01-31 DIAGNOSIS — F17210 Nicotine dependence, cigarettes, uncomplicated: Secondary | ICD-10-CM | POA: Diagnosis not present

## 2015-01-31 DIAGNOSIS — I10 Essential (primary) hypertension: Secondary | ICD-10-CM | POA: Insufficient documentation

## 2015-01-31 DIAGNOSIS — Z30432 Encounter for removal of intrauterine contraceptive device: Secondary | ICD-10-CM | POA: Diagnosis not present

## 2015-01-31 HISTORY — PX: IUD REMOVAL: SHX5392

## 2015-01-31 LAB — CBC
HCT: 40.1 % (ref 36.0–46.0)
Hemoglobin: 13.9 g/dL (ref 12.0–15.0)
MCH: 31.9 pg (ref 26.0–34.0)
MCHC: 34.7 g/dL (ref 30.0–36.0)
MCV: 92 fL (ref 78.0–100.0)
Platelets: 197 10*3/uL (ref 150–400)
RBC: 4.36 MIL/uL (ref 3.87–5.11)
RDW: 12.4 % (ref 11.5–15.5)
WBC: 4.7 10*3/uL (ref 4.0–10.5)

## 2015-01-31 LAB — TYPE AND SCREEN
ABO/RH(D): B POS
Antibody Screen: NEGATIVE

## 2015-01-31 LAB — ABO/RH: ABO/RH(D): B POS

## 2015-01-31 LAB — PREGNANCY, URINE: Preg Test, Ur: NEGATIVE

## 2015-01-31 SURGERY — REMOVAL, INTRAUTERINE DEVICE
Anesthesia: Monitor Anesthesia Care | Site: Vagina

## 2015-01-31 MED ORDER — PROPOFOL 10 MG/ML IV BOLUS
INTRAVENOUS | Status: AC
  Filled 2015-01-31: qty 20

## 2015-01-31 MED ORDER — LIDOCAINE HCL (CARDIAC) 20 MG/ML IV SOLN
INTRAVENOUS | Status: AC
  Filled 2015-01-31: qty 5

## 2015-01-31 MED ORDER — KETOROLAC TROMETHAMINE 30 MG/ML IJ SOLN: 30.0000 mg | Freq: Once | INTRAMUSCULAR | Status: DC | PRN

## 2015-01-31 MED ORDER — KETOROLAC TROMETHAMINE 30 MG/ML IJ SOLN
INTRAMUSCULAR | Status: DC | PRN
  Administered 2015-01-31: 30 mg via INTRAVENOUS

## 2015-01-31 MED ORDER — ONDANSETRON HCL 4 MG/2ML IJ SOLN
INTRAMUSCULAR | Status: DC | PRN
  Administered 2015-01-31: 4 mg via INTRAVENOUS

## 2015-01-31 MED ORDER — ONDANSETRON HCL 4 MG/2ML IJ SOLN
INTRAMUSCULAR | Status: AC
  Filled 2015-01-31: qty 2

## 2015-01-31 MED ORDER — LACTATED RINGERS IV SOLN
INTRAVENOUS | Status: DC
  Administered 2015-01-31 (×2): via INTRAVENOUS

## 2015-01-31 MED ORDER — ONDANSETRON HCL 4 MG/2ML IJ SOLN: 4.0000 mg | Freq: Once | INTRAMUSCULAR | Status: DC | PRN

## 2015-01-31 MED ORDER — PROPOFOL 10 MG/ML IV BOLUS
INTRAVENOUS | Status: DC | PRN
  Administered 2015-01-31: 200 mg via INTRAVENOUS

## 2015-01-31 MED ORDER — KETOROLAC TROMETHAMINE 30 MG/ML IJ SOLN
INTRAMUSCULAR | Status: AC
  Filled 2015-01-31: qty 1

## 2015-01-31 MED ORDER — FENTANYL CITRATE (PF) 100 MCG/2ML IJ SOLN
INTRAMUSCULAR | Status: AC
  Filled 2015-01-31: qty 2

## 2015-01-31 MED ORDER — MEPERIDINE HCL 25 MG/ML IJ SOLN: 6.2500 mg | INTRAMUSCULAR | Status: DC | PRN

## 2015-01-31 MED ORDER — DEXAMETHASONE SODIUM PHOSPHATE 4 MG/ML IJ SOLN
INTRAMUSCULAR | Status: AC
  Filled 2015-01-31: qty 1

## 2015-01-31 MED ORDER — SCOPOLAMINE 1 MG/3DAYS TD PT72
1.0000 | MEDICATED_PATCH | TRANSDERMAL | Status: DC
  Administered 2015-01-31: 1.5 mg via TRANSDERMAL

## 2015-01-31 MED ORDER — MIDAZOLAM HCL 2 MG/2ML IJ SOLN
INTRAMUSCULAR | Status: AC
  Filled 2015-01-31: qty 2

## 2015-01-31 MED ORDER — FENTANYL CITRATE (PF) 100 MCG/2ML IJ SOLN
INTRAMUSCULAR | Status: DC | PRN
  Administered 2015-01-31: 100 ug via INTRAVENOUS

## 2015-01-31 MED ORDER — MIDAZOLAM HCL 2 MG/2ML IJ SOLN
INTRAMUSCULAR | Status: DC | PRN
  Administered 2015-01-31: 2 mg via INTRAVENOUS

## 2015-01-31 MED ORDER — SCOPOLAMINE 1 MG/3DAYS TD PT72
MEDICATED_PATCH | TRANSDERMAL | Status: AC
  Filled 2015-01-31: qty 1

## 2015-01-31 MED ORDER — DEXAMETHASONE SODIUM PHOSPHATE 10 MG/ML IJ SOLN
INTRAMUSCULAR | Status: DC | PRN
  Administered 2015-01-31: 4 mg via INTRAVENOUS

## 2015-01-31 MED ORDER — FENTANYL CITRATE (PF) 100 MCG/2ML IJ SOLN: 25.0000 ug | INTRAMUSCULAR | Status: DC | PRN

## 2015-01-31 SURGICAL SUPPLY — 9 items
CATH ROBINSON RED A/P 16FR (CATHETERS) IMPLANT
CLOTH BEACON ORANGE TIMEOUT ST (SAFETY) IMPLANT
GLOVE BIO SURGEON STRL SZ 6.5 (GLOVE) ×2 IMPLANT
GOWN STRL REUS W/TWL LRG LVL3 (GOWN DISPOSABLE) ×4 IMPLANT
NEEDLE SPNL 22GX3.5 QUINCKE BK (NEEDLE) IMPLANT
PACK VAGINAL MINOR WOMEN LF (CUSTOM PROCEDURE TRAY) IMPLANT
PAD PREP 24X48 CUFFED NSTRL (MISCELLANEOUS) ×2 IMPLANT
TOWEL OR 17X24 6PK STRL BLUE (TOWEL DISPOSABLE) ×2 IMPLANT
WATER STERILE IRR 1000ML POUR (IV SOLUTION) ×2 IMPLANT

## 2015-01-31 NOTE — Anesthesia Postprocedure Evaluation (Signed)
Anesthesia Post Note  Patient: Sydney Henry  Procedure(s) Performed: Procedure(s) (LRB): INTRAUTERINE DEVICE (IUD) REMOVAL (N/A)  Anesthesia type: General  Patient location: PACU  Post pain: Pain level controlled  Post assessment: Post-op Vital signs reviewed  Last Vitals:  Filed Vitals:   01/31/15 1600  BP: 119/68  Pulse: 41  Temp:   Resp: 17    Post vital signs: Reviewed  Level of consciousness: sedated  Complications: No apparent anesthesia complications

## 2015-01-31 NOTE — Anesthesia Preprocedure Evaluation (Signed)
Anesthesia Evaluation    Reviewed: Allergy & Precautions, H&P , NPO status , Patient's Chart, lab work & pertinent test results, reviewed documented beta blocker date and time   Airway Mallampati: I  TM Distance: >3 FB Neck ROM: full    Dental  (+) Teeth Intact   Pulmonary Current Smoker,    Pulmonary exam normal       Cardiovascular Normal cardiovascular exam    Neuro/Psych negative neurological ROS  negative psych ROS   GI/Hepatic negative GI ROS, Neg liver ROS,   Endo/Other  negative endocrine ROS  Renal/GU negative Renal ROS     Musculoskeletal   Abdominal Normal abdominal exam  (+)   Peds  Hematology   Anesthesia Other Findings   Reproductive/Obstetrics negative OB ROS                             Anesthesia Physical Anesthesia Plan  ASA: II  Anesthesia Plan: General and MAC   Post-op Pain Management:    Induction: Intravenous  Airway Management Planned: Mask, LMA and Natural Airway  Additional Equipment:   Intra-op Plan:   Post-operative Plan:   Informed Consent: I have reviewed the patients History and Physical, chart, labs and discussed the procedure including the risks, benefits and alternatives for the proposed anesthesia with the patient or authorized representative who has indicated his/her understanding and acceptance.     Plan Discussed with: CRNA and Surgeon  Anesthesia Plan Comments:         Anesthesia Quick Evaluation

## 2015-01-31 NOTE — Discharge Instructions (Signed)

## 2015-01-31 NOTE — Op Note (Signed)
01/31/2015  2:43 PM  PATIENT:  Sydney Henry  24 y.o. female  PRE-OPERATIVE DIAGNOSIS:  cpt - Removal of IUD  POST-OPERATIVE DIAGNOSIS:  cpt -retained IUD  PROCEDURE:  Procedure(s): INTRAUTERINE DEVICE (IUD) REMOVAL (N/A)  SURGEON:  Surgeon(s) and Role:    * Allie BossierMyra C Veldon Wager, MD - Primary   ANESTHESIA:   general  EBL:     BLOOD ADMINISTERED:none  DRAINS: none   LOCAL MEDICATIONS USED:  NONE  SPECIMEN:  No Specimen  DISPOSITION OF SPECIMEN:  N/A  COUNTS:  YES  TOURNIQUET:  * No tourniquets in log *  DICTATION: .Dragon Dictation  PLAN OF CARE: Discharge to home after PACU  PATIENT DISPOSITION:  PACU - hemodynamically stable.   Delay start of Pharmacological VTE agent (>24hrs) due to surgical blood loss or risk of bleeding: not applicable  The risks, benefits, and alternatives of surgery were explained, understood, and accepted. She is aware that she is fertile the moment the IUD is removed. She plans to use condoms. In the OR, she was placed in the dorsal lithotomy position and reported herself to be comfortable. General anesthesia was given without complication. Her vagina was prepped in the usual sterile fashion. I grasped the anterior lip of the cervix with a single tooth tenaculum. I used a polyp forceps to reach into the uterine cavity and remove the intact Mirena. Silver nitrate was placed on the tenaculum sites to yield hemostasis. She was extubated and taken to the recovery room in stable condition. She tolerated the procedure well.

## 2015-01-31 NOTE — Transfer of Care (Signed)
Immediate Anesthesia Transfer of Care Note  Patient: Sydney Henry  Procedure(s) Performed: Procedure(s): INTRAUTERINE DEVICE (IUD) REMOVAL (N/A)  Patient Location: PACU  Anesthesia Type:General  Level of Consciousness: awake, alert  and oriented  Airway & Oxygen Therapy: Patient Spontanous Breathing and Patient connected to nasal cannula oxygen  Post-op Assessment: Report given to RN and Post -op Vital signs reviewed and stable  Post vital signs: Reviewed and stable  Last Vitals:  Filed Vitals:   01/31/15 1327  BP: 118/70  Pulse: 50  Temp: 37.2 C  Resp: 18    Complications: No apparent anesthesia complications

## 2015-01-31 NOTE — Anesthesia Procedure Notes (Signed)
Procedure Name: LMA Insertion Date/Time: 01/31/2015 2:36 PM Performed by: Jonaven Hilgers, Jannet AskewHARLESETTA Henry Pre-anesthesia Checklist: Patient identified, Patient being monitored, Emergency Drugs available, Timeout performed and Suction available Patient Re-evaluated:Patient Re-evaluated prior to inductionOxygen Delivery Method: Circle system utilized Preoxygenation: Pre-oxygenation with 100% oxygen Intubation Type: IV induction LMA: LMA inserted LMA Size: 3.0 Number of attempts: 1

## 2015-02-01 ENCOUNTER — Encounter (HOSPITAL_COMMUNITY): Payer: Self-pay | Admitting: Obstetrics & Gynecology

## 2015-02-01 NOTE — H&P (Signed)
Sydney Henry is an 24 y.o. yo S AA P1 here because she would like her 24 yo Mirena removed. She has been having irregular bleeding and she is quite through with this method. She is not interested in adding ERT/OCPs to try to control the bleeding. [facility]. Her Mirena was unable to be removed in the office. She plans to use condoms after removal of Mirena.  Patient's last menstrual period was 01/07/2015.    Past Medical History  Diagnosis Date  . Anemia   . Hypertension     during pregnancy  . Vaginal delivery 2013    Past Surgical History  Procedure Laterality Date  . Induced abortion    . Dilation and curettage of uterus      Family History  Problem Relation Age of Onset  . Hypertension Father   . Diabetes Father   . Diabetes Maternal Aunt   . Hypertension Maternal Aunt   . Cancer Maternal Grandmother   . Diabetes Maternal Grandfather   . Diabetes Paternal Grandmother   . Hypertension Paternal Grandmother   . Cancer Paternal Grandmother     Social History:  reports that she has been smoking Cigarettes.  She has a .125 pack-year smoking history. She has never used smokeless tobacco. She reports that she drinks about 0.6 oz of alcohol per week. She reports that she uses illicit drugs (Marijuana) about 7 times per week.  Allergies: No Known Allergies  No prescriptions prior to admission    ROS  Blood pressure 125/73, pulse 50, temperature 97.8 F (36.6 C), temperature source Oral, resp. rate 18, height 5\' 8"  (1.727 m), weight 152 lb (68.947 kg), last menstrual period 01/07/2015, SpO2 100 %. Physical Exam Heart- rrr Lungs- CTAB Abd- benign Results for orders placed or performed during the hospital encounter of 01/31/15 (from the past 24 hour(s))  Pregnancy, urine     Status: None   Collection Time: 01/31/15  1:00 PM  Result Value Ref Range   Preg Test, Ur NEGATIVE NEGATIVE  CBC     Status: None   Collection Time:  01/31/15  1:25 PM  Result Value Ref Range   WBC 4.7 4.0 - 10.5 K/uL   RBC 4.36 3.87 - 5.11 MIL/uL   Hemoglobin 13.9 12.0 - 15.0 g/dL   HCT 78.9 38.1 - 01.7 %   MCV 92.0 78.0 - 100.0 fL   MCH 31.9 26.0 - 34.0 pg   MCHC 34.7 30.0 - 36.0 g/dL   RDW 51.0 25.8 - 52.7 %   Platelets 197 150 - 400 K/uL  Type and screen     Status: None   Collection Time: 01/31/15  1:25 PM  Result Value Ref Range   ABO/RH(D) B POS    Antibody Screen NEG    Sample Expiration 02/03/2015   ABO/Rh     Status: None   Collection Time: 01/31/15  1:25 PM  Result Value Ref Range   ABO/RH(D) B POS     No results found.  Assessment/Plan: Retained IUD- Plan for removal in the office  Sydney Henry C. 02/01/2015, 8:51 AM

## 2015-02-06 ENCOUNTER — Encounter: Payer: Self-pay | Admitting: General Practice

## 2015-04-03 ENCOUNTER — Emergency Department (HOSPITAL_COMMUNITY)
Admission: EM | Admit: 2015-04-03 | Discharge: 2015-04-03 | Disposition: A | Discharge status: Home / Self Care | Payer: 59 | Attending: Emergency Medicine | Admitting: Emergency Medicine

## 2015-04-03 ENCOUNTER — Emergency Department (HOSPITAL_COMMUNITY): Payer: 59

## 2015-04-03 ENCOUNTER — Encounter (HOSPITAL_COMMUNITY): Payer: Self-pay | Admitting: *Deleted

## 2015-04-03 DIAGNOSIS — Z862 Personal history of diseases of the blood and blood-forming organs and certain disorders involving the immune mechanism: Secondary | ICD-10-CM | POA: Diagnosis not present

## 2015-04-03 DIAGNOSIS — Z3202 Encounter for pregnancy test, result negative: Secondary | ICD-10-CM | POA: Insufficient documentation

## 2015-04-03 DIAGNOSIS — J209 Acute bronchitis, unspecified: Secondary | ICD-10-CM | POA: Insufficient documentation

## 2015-04-03 DIAGNOSIS — R197 Diarrhea, unspecified: Secondary | ICD-10-CM | POA: Insufficient documentation

## 2015-04-03 DIAGNOSIS — R05 Cough: Secondary | ICD-10-CM | POA: Diagnosis present

## 2015-04-03 DIAGNOSIS — R112 Nausea with vomiting, unspecified: Secondary | ICD-10-CM | POA: Insufficient documentation

## 2015-04-03 DIAGNOSIS — Z79899 Other long term (current) drug therapy: Secondary | ICD-10-CM | POA: Diagnosis not present

## 2015-04-03 DIAGNOSIS — Z720 Tobacco use: Secondary | ICD-10-CM | POA: Insufficient documentation

## 2015-04-03 LAB — POC URINE PREG, ED: Preg Test, Ur: NEGATIVE

## 2015-04-03 MED ORDER — PREDNISONE 20 MG PO TABS
40.0000 mg | ORAL_TABLET | Freq: Once | ORAL | Status: AC
  Administered 2015-04-03: 40 mg via ORAL
  Filled 2015-04-03: qty 2

## 2015-04-03 MED ORDER — AZITHROMYCIN 250 MG PO TABS: 250.0000 mg | ORAL_TABLET | Freq: Every day | ORAL | Status: DC

## 2015-04-03 MED ORDER — ALBUTEROL SULFATE HFA 108 (90 BASE) MCG/ACT IN AERS
2.0000 | INHALATION_SPRAY | RESPIRATORY_TRACT | Status: DC | PRN
  Administered 2015-04-03: 2 via RESPIRATORY_TRACT
  Filled 2015-04-03: qty 6.7

## 2015-04-03 MED ORDER — BENZONATATE 200 MG PO CAPS: 200.0000 mg | ORAL_CAPSULE | Freq: Three times a day (TID) | ORAL | Status: DC | PRN

## 2015-04-03 MED ORDER — IPRATROPIUM-ALBUTEROL 0.5-2.5 (3) MG/3ML IN SOLN
3.0000 mL | Freq: Once | RESPIRATORY_TRACT | Status: AC
  Administered 2015-04-03: 3 mL via RESPIRATORY_TRACT
  Filled 2015-04-03: qty 3

## 2015-04-03 MED ORDER — ALBUTEROL SULFATE (2.5 MG/3ML) 0.083% IN NEBU
2.5000 mg | INHALATION_SOLUTION | Freq: Once | RESPIRATORY_TRACT | Status: AC
  Administered 2015-04-03: 2.5 mg via RESPIRATORY_TRACT
  Filled 2015-04-03: qty 3

## 2015-04-03 MED ORDER — PREDNISONE 10 MG PO TABS: 20.0000 mg | ORAL_TABLET | Freq: Every day | ORAL | Status: DC

## 2015-04-03 NOTE — Discharge Instructions (Signed)
Cool Mist Vaporizers °Vaporizers may help relieve the symptoms of a cough and cold. They add moisture to the air, which helps mucus to become thinner and less sticky. This makes it easier to breathe and cough up secretions. Cool mist vaporizers do not cause serious burns like hot mist vaporizers, which may also be called steamers or humidifiers. Vaporizers have not been proven to help with colds. You should not use a vaporizer if you are allergic to mold. °HOME CARE INSTRUCTIONS °· Follow the package instructions for the vaporizer. °· Do not use anything other than distilled water in the vaporizer. °· Do not run the vaporizer all of the time. This can cause mold or bacteria to grow in the vaporizer. °· Clean the vaporizer after each time it is used. °· Clean and dry the vaporizer well before storing it. °· Stop using the vaporizer if worsening respiratory symptoms develop. °Document Released: 05/08/2004 Document Revised: 08/16/2013 Document Reviewed: 12/29/2012 °ExitCare® Patient Information ©2015 [employer]. This information is not intended to replace advice given to you by your health care provider. Make sure you discuss any questions you have with your health care provider. ° °

## 2015-04-03 NOTE — ED Provider Notes (Signed)
CSN: [id number]     Arrival date & time 04/03/15  1510 History  This chart was scribed for non-physician practitioner, Kerrie Buffalo, NP working with Cathren Laine, MD by Gwenyth Ober, ED scribe. This patient was seen in room TR06C/TR06C and the patient's care was started at 3:37 PM   Chief Complaint  Patient presents with  . Cough  . Chills   Patient is a 24 y.o. female presenting with cough. The history is provided by the patient. No language interpreter was used.  Cough Cough characteristics:  Productive Sputum characteristics:  Yellow Severity:  Moderate Onset quality:  Gradual Duration:  2 days Timing:  Intermittent Progression:  Unchanged Chronicity:  New Smoker: no   Relieved by:  None tried Worsened by:  Nothing tried Ineffective treatments:  Rest and fluids Associated symptoms: chills, fever, sore throat and wheezing   Associated symptoms: no ear pain     HPI Comments: Sydney Henry is a 24 y.o. female with no chronic medical history who presents to the Emergency Department complaining of intermittent, moderate cough with yellow sputum that started 2 days ago. She states 1 episode of hemoptysis, wheezing, chills, sore throat, low grade fever measuring 58F, post-tussive vomiting and diarrhea as associated symptoms. Pt's symptoms become worse at night.  She has tried rest and increased fluid intake with no relief. Pt is requesting a pregnancy test. Her LNMP was 3 weeks ago. Pt had her IUD removed 2 months ago. She denies nausea and ear pain.  Past Medical History  Diagnosis Date  . Anemia   . Hypertension     during pregnancy  . Vaginal delivery 2013   Past Surgical History  Procedure Laterality Date  . Induced abortion    . Dilation and curettage of uterus    . Iud removal N/A 01/31/2015    Procedure: INTRAUTERINE DEVICE (IUD) REMOVAL;  Surgeon: Allie Bossier, MD;  Location: [facility];  Service: Gynecology;  Laterality: N/A;   Family History  Problem Relation Age of Onset   . Hypertension Father   . Diabetes Father   . Diabetes Maternal Aunt   . Hypertension Maternal Aunt   . Cancer Maternal Grandmother   . Diabetes Maternal Grandfather   . Diabetes Paternal Grandmother   . Hypertension Paternal Grandmother   . Cancer Paternal Grandmother    Social History  Substance Use Topics  . Smoking status: Current Every Day Smoker -- 0.25 packs/day for .5 years    Types: Cigarettes    Last Attempt to Quit: 02/23/2011  . Smokeless tobacco: Never Used  . Alcohol Use: 0.6 oz/week    1 Cans of beer per week   OB History    Gravida Para Term Preterm AB TAB SAB Ectopic Multiple Living   Review of Systems  Constitutional: Positive for fever and chills.  HENT: Positive for sore throat. Negative for ear pain.   Respiratory: Positive for cough and wheezing.   Gastrointestinal: Positive for vomiting and diarrhea. Negative for nausea.  All other systems reviewed and are negative.     Allergies  Review of patient's allergies indicates no known allergies.  Home Medications   Prior to Admission medications   Medication Sig Start Date End Date Taking? Authorizing Provider  azithromycin (ZITHROMAX) 250 MG tablet Take 1 tablet (250 mg total) by mouth daily. Take first 2 tablets together, then 1 every day until finished. 04/03/15   Anothony Bursch M  Damian Leavell, NP  benzonatate (TESSALON) 200 MG capsule Take 1 capsule (200 mg total) by mouth 3 (three) times daily as needed for cough. 04/03/15   Markavious Micco Orlene Och, NP  hydrochlorothiazide (HYDRODIURIL) 25 MG tablet Take 1 tablet (25 mg total) by mouth daily. 12/04/11 12/03/12  August Luz, CNM  predniSONE (DELTASONE) 10 MG tablet Take 2 tablets (20 mg total) by mouth daily. 04/03/15   Gavriela Cashin Orlene Och, NP  traMADol (ULTRAM) 50 MG tablet Take 100 mg by mouth once. 09/23/14   Historical Provider, MD   BP 129/88 mmHg  Pulse 79  Temp(Src) 98.2 F (36.8 C) (Oral)  Resp 20  Ht 5\' 8"  (1.727 m)  Wt 152 lb (68.947 kg)  BMI 23.12  kg/m2  SpO2 99%  LMP 03/16/2015 (Exact Date) Physical Exam  Constitutional: She appears well-developed and well-nourished. No distress.  HENT:  Head: Normocephalic and atraumatic.  Right Ear: Tympanic membrane and external ear normal.  Left Ear: Tympanic membrane and external ear normal.  Mouth/Throat: Oropharynx is clear and moist. No oropharyngeal exudate.  Eyes: Conjunctivae and EOM are normal.  Neck: Neck supple. No tracheal deviation present.  Cardiovascular: Normal rate, regular rhythm and normal heart sounds.   Pulmonary/Chest: Effort normal. No respiratory distress. She has wheezes.  Inspiratory and expiratory wheezing bilaterally  Abdominal: Soft. There is no tenderness.  Musculoskeletal: Normal range of motion.  Skin: Skin is warm and dry.  Psychiatric: She has a normal mood and affect. Her behavior is normal.  Nursing note and vitals reviewed.   ED Course  Procedures   DIAGNOSTIC STUDIES: Oxygen Saturation is 99% on RA, normal by my interpretation.    COORDINATION OF CARE: 3:45 PM Discussed treatment plan with pt at bedside which includes a pregnancy test and a chest x-ray. Pt agreed to plan.  4:51 PM Upon re-evaluation, pt has increased air movement. She still has some inspiratory and expiratory wheezing an rhonchi. Will order a second albuterol treatment and prednisone-40 mg PO.  6:08 PM Pt is feeling much better. On re-evaluation, only occasional wheezes heard.  Albuterol inhaler with instructions on use given to patient her in the ED prior to d/c.  Labs Review Labs Reviewed  POC URINE PREG, ED    Imaging Review Dg Chest 2 View  04/03/2015   CLINICAL DATA:  Short of breath.  Chest pain.  Wheezing.  Fever.  EXAM: CHEST  2 VIEW  COMPARISON:  None.  FINDINGS: Cardiopericardial silhouette within normal limits. Mediastinal contours normal. Trachea midline. No airspace disease or effusion.  IMPRESSION: No active cardiopulmonary disease.   Electronically Signed   By:  Andreas Newport M.D.   On: 04/03/2015 16:48     MDM  24 y.o. female with cough and wheezing x 2 days and a few episodes of n/v/d that has improved. Stable for d/c without respiratory distress. O2 SAT 9% on R/A. Will treat for bronchitis. Patient given information for [facility].  Discussed with the patient clinical and x-ray findings and plan of care and all questioned fully answered. She will return if any problems arise.   Final diagnoses:  Bronchitis with bronchospasm  Nausea vomiting and diarrhea   I personally performed the services described in this documentation, which was scribed in my presence. The recorded information has been reviewed and is accurate.     [address] [place], [place] 04/05/15 0981  Cathren Laine, MD 04/07/15 [phone]

## 2015-04-03 NOTE — ED Notes (Signed)
Pt c/o chills, dry cough and n/v x2 days

## 2015-09-10 ENCOUNTER — Ambulatory Visit (INDEPENDENT_AMBULATORY_CARE_PROVIDER_SITE_OTHER): Payer: 59 | Admitting: Obstetrics & Gynecology

## 2015-09-10 ENCOUNTER — Encounter: Payer: Self-pay | Admitting: Obstetrics & Gynecology

## 2015-09-10 VITALS — BP 122/65 | HR 68 | Temp 98.5°F | Ht 68.0 in | Wt 158.3 lb

## 2015-09-10 DIAGNOSIS — Z113 Encounter for screening for infections with a predominantly sexual mode of transmission: Secondary | ICD-10-CM

## 2015-09-10 DIAGNOSIS — Z124 Encounter for screening for malignant neoplasm of cervix: Secondary | ICD-10-CM | POA: Diagnosis not present

## 2015-09-10 DIAGNOSIS — Z1151 Encounter for screening for human papillomavirus (HPV): Secondary | ICD-10-CM | POA: Diagnosis not present

## 2015-09-10 DIAGNOSIS — Z0000 Encounter for general adult medical examination without abnormal findings: Secondary | ICD-10-CM

## 2015-09-10 DIAGNOSIS — Z01419 Encounter for gynecological examination (general) (routine) without abnormal findings: Secondary | ICD-10-CM | POA: Diagnosis not present

## 2015-09-10 DIAGNOSIS — R87610 Atypical squamous cells of undetermined significance on cytologic smear of cervix (ASC-US): Secondary | ICD-10-CM | POA: Diagnosis not present

## 2015-09-10 NOTE — Progress Notes (Signed)
Here for annual checkup.  [facility]. States had IUD out June. Thinks she wants to start depo-provera.  Has had unprotected intercourse last 09/02/15.
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
## 2015-09-11 LAB — HEPATITIS B SURFACE ANTIGEN: Hepatitis B Surface Ag: NEGATIVE

## 2015-09-11 LAB — CYTOLOGY - PAP

## 2015-09-11 LAB — HIV ANTIBODY (ROUTINE TESTING W REFLEX): HIV 1&2 Ab, 4th Generation: NONREACTIVE

## 2015-09-11 LAB — HEPATITIS C ANTIBODY: HCV Ab: NEGATIVE

## 2015-09-11 LAB — SYPHILIS: RPR W/REFLEX TO RPR TITER AND TREPONEMAL ANTIBODIES, TRADITIONAL SCREENING AND DIAGNOSIS ALGORITHM

## 2015-09-19 ENCOUNTER — Telehealth: Payer: Self-pay | Admitting: General Practice

## 2015-09-19 NOTE — Telephone Encounter (Signed)
Per Dr Marice Potter, patient's pap shows ASCUS + HR HPV and will need a colpo. Called patient and explained results to her & need for colposcopy & that someone from the front office will be contacting her to set up that appt. Patient verbalized understanding & asked if her STD testing was negative. Told patient correct. Patient verbalized understanding & had no other questions

## 2015-10-15 ENCOUNTER — Encounter: Payer: 59 | Admitting: Obstetrics and Gynecology

## 2015-11-08 ENCOUNTER — Encounter: Payer: 59 | Admitting: Family Medicine

## 2015-12-05 ENCOUNTER — Encounter: Payer: Self-pay | Admitting: Obstetrics & Gynecology

## 2015-12-05 ENCOUNTER — Ambulatory Visit (INDEPENDENT_AMBULATORY_CARE_PROVIDER_SITE_OTHER): Payer: 59 | Admitting: Obstetrics & Gynecology

## 2015-12-05 ENCOUNTER — Other Ambulatory Visit (HOSPITAL_COMMUNITY)
Admission: RE | Admit: 2015-12-05 | Discharge: 2015-12-05 | Disposition: A | Discharge status: Home / Self Care | Payer: 59 | Source: Ambulatory Visit | Attending: Obstetrics & Gynecology | Admitting: Obstetrics & Gynecology

## 2015-12-05 VITALS — BP 123/83 | HR 57 | Ht 68.5 in | Wt 159.3 lb

## 2015-12-05 DIAGNOSIS — R896 Abnormal cytological findings in specimens from other organs, systems and tissues: Secondary | ICD-10-CM

## 2015-12-05 DIAGNOSIS — Z3202 Encounter for pregnancy test, result negative: Secondary | ICD-10-CM | POA: Diagnosis not present

## 2015-12-05 DIAGNOSIS — R87810 Cervical high risk human papillomavirus (HPV) DNA test positive: Secondary | ICD-10-CM | POA: Diagnosis not present

## 2015-12-05 DIAGNOSIS — N880 Leukoplakia of cervix uteri: Secondary | ICD-10-CM | POA: Diagnosis not present

## 2015-12-05 DIAGNOSIS — IMO0002 Reserved for concepts with insufficient information to code with codable children: Secondary | ICD-10-CM

## 2015-12-05 DIAGNOSIS — R87610 Atypical squamous cells of undetermined significance on cytologic smear of cervix (ASC-US): Secondary | ICD-10-CM | POA: Diagnosis not present

## 2015-12-05 LAB — POCT PREGNANCY, URINE
Preg Test, Ur: NEGATIVE
Preg Test, Ur: NEGATIVE

## 2015-12-05 NOTE — Progress Notes (Signed)
   Subjective:    Patient ID: Sydney RobinsAlika D Henry, female    DOB: May 04, 1991, 25 y.o.   MRN: [id number]  HPI  25 yo AA lady here for colpo due to ASCUS + HR HPV pap.  Review of Systems     Objective:   Physical Exam  [id number] Breathing, conversing, and ambulating normally UPT negative, consent signed, time out done Cervix prepped with acetic acid. Transformation zone seen in its entirety. Colpo adequate. Dense white lesion at the 10-12 o'clock position, seen with and without acetic acid I biopsied this area and used silver nitrate to yield hemostasis ECC obtained. She tolerated the procedure well.        Assessment & Plan:  ASCUS +HR HPV pap, colpo findings c/w CIN1 Await biopsy and ECC results. RTC 2 weeks for results

## 2015-12-11 ENCOUNTER — Telehealth: Payer: Self-pay

## 2015-12-11 NOTE — Telephone Encounter (Signed)
Spoke with patient she is aware to follow up in one year with co-testing and pap

## 2016-04-28 ENCOUNTER — Encounter (HOSPITAL_COMMUNITY): Payer: Self-pay | Admitting: *Deleted

## 2016-04-28 ENCOUNTER — Emergency Department (HOSPITAL_COMMUNITY)
Admission: EM | Admit: 2016-04-28 | Discharge: 2016-04-28 | Disposition: A | Discharge status: Home / Self Care | Payer: 59 | Attending: Emergency Medicine | Admitting: Emergency Medicine

## 2016-04-28 DIAGNOSIS — S0993XA Unspecified injury of face, initial encounter: Secondary | ICD-10-CM | POA: Diagnosis not present

## 2016-04-28 DIAGNOSIS — Y999 Unspecified external cause status: Secondary | ICD-10-CM | POA: Insufficient documentation

## 2016-04-28 DIAGNOSIS — F17210 Nicotine dependence, cigarettes, uncomplicated: Secondary | ICD-10-CM | POA: Insufficient documentation

## 2016-04-28 DIAGNOSIS — Y939 Activity, unspecified: Secondary | ICD-10-CM | POA: Insufficient documentation

## 2016-04-28 DIAGNOSIS — T7411XA Adult physical abuse, confirmed, initial encounter: Secondary | ICD-10-CM | POA: Diagnosis not present

## 2016-04-28 DIAGNOSIS — Y929 Unspecified place or not applicable: Secondary | ICD-10-CM | POA: Insufficient documentation

## 2016-04-28 DIAGNOSIS — I10 Essential (primary) hypertension: Secondary | ICD-10-CM | POA: Diagnosis not present
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
## 2016-04-28 NOTE — ED Notes (Signed)
Declined W/C at D/C and was escorted to lobby by RN. 
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
## 2016-11-03 ENCOUNTER — Ambulatory Visit: Payer: 59 | Admitting: Family Medicine

## 2016-11-03 DIAGNOSIS — R87810 Cervical high risk human papillomavirus (HPV) DNA test positive: Secondary | ICD-10-CM

## 2016-11-03 DIAGNOSIS — R87610 Atypical squamous cells of undetermined significance on cytologic smear of cervix (ASC-US): Secondary | ICD-10-CM | POA: Insufficient documentation

## 2016-11-03 HISTORY — DX: Atypical squamous cells of undetermined significance on cytologic smear of cervix (ASC-US): R87.610

## 2016-11-03 HISTORY — DX: Cervical high risk human papillomavirus (HPV) DNA test positive: R87.810

## 2016-11-12 ENCOUNTER — Ambulatory Visit (INDEPENDENT_AMBULATORY_CARE_PROVIDER_SITE_OTHER): Payer: Medicaid Other | Admitting: Advanced Practice Midwife

## 2016-11-12 ENCOUNTER — Encounter: Payer: Self-pay | Admitting: Advanced Practice Midwife

## 2016-11-12 ENCOUNTER — Other Ambulatory Visit (HOSPITAL_COMMUNITY)
Admission: RE | Admit: 2016-11-12 | Discharge: 2016-11-12 | Disposition: A | Discharge status: Home / Self Care | Payer: Medicaid Other | Source: Ambulatory Visit | Attending: Advanced Practice Midwife | Admitting: Advanced Practice Midwife

## 2016-11-12 VITALS — BP 122/96 | HR 88 | Ht 68.0 in | Wt 155.6 lb

## 2016-11-12 DIAGNOSIS — R87610 Atypical squamous cells of undetermined significance on cytologic smear of cervix (ASC-US): Secondary | ICD-10-CM

## 2016-11-12 DIAGNOSIS — Z01419 Encounter for gynecological examination (general) (routine) without abnormal findings: Secondary | ICD-10-CM | POA: Diagnosis not present

## 2016-11-12 DIAGNOSIS — R030 Elevated blood-pressure reading, without diagnosis of hypertension: Secondary | ICD-10-CM | POA: Insufficient documentation

## 2016-11-12 DIAGNOSIS — Z0000 Encounter for general adult medical examination without abnormal findings: Secondary | ICD-10-CM | POA: Diagnosis present

## 2016-11-12 DIAGNOSIS — R87810 Cervical high risk human papillomavirus (HPV) DNA test positive: Secondary | ICD-10-CM

## 2016-11-12 NOTE — Patient Instructions (Signed)
Hypertension °Hypertension is another name for high blood pressure. High blood pressure forces your heart to work harder to pump blood. This can cause problems over time. °There are two numbers in a blood pressure reading. There is a top number (systolic) over a bottom number (diastolic). It is best to have a blood pressure below 120/80. Healthy choices can help lower your blood pressure. You may need medicine to help lower your blood pressure if: °· Your blood pressure cannot be lowered with healthy choices. °· Your blood pressure is higher than 130/80. °Follow these instructions at home: °Eating and drinking  °· If directed, follow the DASH eating plan. This diet includes: °¨ Filling half of your plate at each meal with fruits and vegetables. °¨ Filling one quarter of your plate at each meal with whole grains. Whole grains include whole wheat pasta, brown rice, and whole grain bread. °¨ Eating or drinking low-fat dairy products, such as skim milk or low-fat yogurt. °¨ Filling one quarter of your plate at each meal with low-fat (lean) proteins. Low-fat proteins include fish, skinless chicken, eggs, beans, and tofu. °¨ Avoiding fatty meat, cured and processed meat, or chicken with skin. °¨ Avoiding premade or processed food. °· Eat less than 1,500 mg of salt (sodium) a day. °· Limit alcohol use to no more than 1 drink a day for nonpregnant women and 2 drinks a day for men. One drink equals 12 oz of beer, 5 oz of wine, or 1½ oz of hard liquor. °Lifestyle  °· Work with your doctor to stay at a healthy weight or to lose weight. Ask your doctor what the best weight is for you. °· Get at least 30 minutes of exercise that causes your heart to beat faster (aerobic exercise) most days of the week. This may include walking, swimming, or biking. °· Get at least 30 minutes of exercise that strengthens your muscles (resistance exercise) at least 3 days a week. This may include lifting weights or pilates. °· Do not use any  products that contain nicotine or tobacco. This includes cigarettes and e-cigarettes. If you need help quitting, ask your doctor. °· Check your blood pressure at home as told by your doctor. °· Keep all follow-up visits as told by your doctor. This is important. °Medicines  °· Take over-the-counter and prescription medicines only as told by your doctor. Follow directions carefully. °· Do not skip doses of blood pressure medicine. The medicine does not work as well if you skip doses. Skipping doses also puts you at risk for problems. °· Ask your doctor about side effects or reactions to medicines that you should watch for. °Contact a doctor if: °· You think you are having a reaction to the medicine you are taking. °· You have headaches that keep coming back (recurring). °· You feel dizzy. °· You have swelling in your ankles. °· You have trouble with your vision. °Get help right away if: °· You get a very bad headache. °· You start to feel confused. °· You feel weak or numb. °· You feel faint. °· You get very bad pain in your: °¨ Chest. °¨ Belly (abdomen). °· You throw up (vomit) more than once. °· You have trouble breathing. °Summary °· Hypertension is another name for high blood pressure. °· Making healthy choices can help lower blood pressure. If your blood pressure cannot be controlled with healthy choices, you may need to take medicine. °This information is not intended to replace advice given to you by your   health care provider. Make sure you discuss any questions you have with your health care provider. °Document Released: 01/28/2008 Document Revised: 07/09/2016 Document Reviewed: 07/09/2016 °Elsevier Interactive Patient Education © 2017 [employer] ° °

## 2016-11-12 NOTE — Progress Notes (Signed)
Would like std testing.

## 2016-11-12 NOTE — Progress Notes (Signed)
GYNECOLOGY ANNUAL PREVENTATIVE CARE ENCOUNTER NOTE  Subjective:   Sydney Henry is a 26 y.o. G51P1011 female here for a routine annual gynecologic exam.  Current complaints: Previous abnormal pap (ascus) with colpo done by Dr Marice Potter in 12/05/15. It showed no dysplasia   Denies abnormal vaginal bleeding, discharge, pelvic pain, problems with intercourse or other gynecologic concerns.    Gynecologic History Patient's last menstrual period was 10/21/2016. Contraception: condoms Last Pap: 2017. Results were: abnormal Last mammogram: none  Obstetric History OB History  Gravida Para Term Preterm AB Living  2 1 1   1 1   SAB TAB Ectopic Multiple Live Births    1     1    # Outcome Date GA Lbr Len/2nd Weight Sex Delivery Anes PTL Lv  2 Term 10/23/11 [redacted]w[redacted]d 20:27 / 00:15 6 lb 15.5 oz (3.161 kg) F Vag-Spont EPI  LIV     Birth Comments: WNL  1 TAB 02/13/08             Birth Comments: No complications      Past Medical History:  Diagnosis Date  . Hypertension    during pregnancy  . Vaginal delivery 2013    Past Surgical History:  Procedure Laterality Date  . DILATION AND CURETTAGE OF UTERUS    . INDUCED ABORTION    . IUD REMOVAL N/A 01/31/2015   Procedure: INTRAUTERINE DEVICE (IUD) REMOVAL;  Surgeon: Allie Bossier, MD;  Location: [facility];  Service: Gynecology;  Laterality: N/A;    Current Outpatient Prescriptions on File Prior to Visit  Medication Sig Dispense Refill  . [DISCONTINUED] promethazine (PHENERGAN) 25 MG tablet Take 0.5-1 tablets (12.5-25 mg total) by mouth every 6 (six) hours as needed for nausea or vomiting. (Patient not taking: Reported on 09/25/2014) 30 tablet 0   No current facility-administered medications on file prior to visit.     No Known Allergies  Social History   Social History  . Marital status: Single    Spouse name: N/A  . Number of children: N/A  . Years of education: N/A   Occupational History  . Not on file.   Social History Main Topics  . Smoking  status: Current Every Day Smoker    Packs/day: 0.25    Years: 0.50    Types: Cigarettes    Last attempt to quit: 02/23/2011  . Smokeless tobacco: Never Used  . Alcohol use 0.6 oz/week    1 Cans of beer per week  . Drug use: Yes    Frequency: 7.0 times per week    Types: Marijuana  . Sexual activity: Yes    Birth control/ protection: None   Other Topics Concern  . Not on file   Social History Narrative  . No narrative on file    Family History  Problem Relation Age of Onset  . Diabetes Paternal Grandmother   . Hypertension Paternal Grandmother   . Cancer Paternal Grandmother   . Hypertension Father   . Diabetes Father   . Diabetes Maternal Aunt   . Hypertension Maternal Aunt   . Cancer Maternal Grandmother   . Diabetes Maternal Grandfather     The following portions of the patient's history were reviewed and updated as appropriate: allergies, current medications, past family history, past medical history, past social history, past surgical history and problem list.  Review of Systems Pertinent items are noted in HPI.   Objective:  BP (!) 122/96   Pulse 88   Ht 5\' 8"  (1.727  m)   Wt 155 lb 9.6 oz (70.6 kg)   LMP 10/21/2016   BMI 23.66 kg/m  CONSTITUTIONAL: Well-developed, well-nourished female in no acute distress.  HENT:  Normocephalic, atraumatic, External right and left ear normal. Oropharynx is clear and moist EYES: Conjunctivae and EOM are normal. Pupils are equal, round, and reactive to light. No scleral icterus.  NECK: Normal range of motion, supple, no masses.  Normal thyroid.  SKIN: Skin is warm and dry. No rash noted. Not diaphoretic. No erythema. No pallor. NEUROLOGIC: Alert and oriented to person, place, and time. Normal reflexes, muscle tone coordination. No cranial nerve deficit noted. PSYCHIATRIC: Normal mood and affect. Normal behavior. Normal judgment and thought content. CARDIOVASCULAR: Normal heart rate noted, regular rhythm RESPIRATORY: Clear to  auscultation bilaterally. Effort and breath sounds normal, no problems with respiration noted. BREASTS: Symmetric in size. No masses, skin changes, nipple drainage, or lymphadenopathy. ABDOMEN: Soft, normal bowel sounds, no distention noted.  No tenderness, rebound or guarding.  PELVIC: Normal appearing external genitalia; normal appearing vaginal mucosa and cervix.  No abnormal discharge noted.  Pap smear obtained.  Normal uterine size, no other palpable masses, no uterine or adnexal tenderness. MUSCULOSKELETAL: Normal range of motion. No tenderness.  No cyanosis, clubbing, or edema.  2+ distal pulses.   Assessment:  Annual gynecologic examination with pap smear   Plan:  Will follow up results of pap smear and manage according Routine preventative health maintenance measures emphasized. Discussed hypertension and importance of treating it.  Need another measurement to make diagnosis, She agrees to start care with a Family Physician Please refer to After Visit Summary for other counseling recommendations.

## 2016-11-13 LAB — CERVICOVAGINAL ANCILLARY ONLY
BACTERIAL VAGINITIS: POSITIVE — AB
CANDIDA VAGINITIS: NEGATIVE
Chlamydia: NEGATIVE
Neisseria Gonorrhea: NEGATIVE
TRICH (WINDOWPATH): NEGATIVE

## 2016-11-14 ENCOUNTER — Telehealth: Payer: Self-pay | Admitting: *Deleted

## 2016-11-14 ENCOUNTER — Other Ambulatory Visit: Payer: Self-pay | Admitting: Advanced Practice Midwife

## 2016-11-14 MED ORDER — METRONIDAZOLE 500 MG PO TABS: 500.0000 mg | ORAL_TABLET | Freq: Two times a day (BID) | ORAL | 0 refills | Status: AC

## 2016-11-14 NOTE — Progress Notes (Signed)
Testing + for Gardnerella  Rx sent for Flagyl x 7d

## 2016-11-14 NOTE — Telephone Encounter (Signed)
Pt notified of +BV test result and medication prescribed. She voiced understanding.

## 2016-11-16 LAB — CYTOLOGY - PAP: Diagnosis: NEGATIVE

## 2016-12-16 ENCOUNTER — Encounter: Payer: Self-pay | Admitting: Family Medicine

## 2016-12-16 ENCOUNTER — Ambulatory Visit (INDEPENDENT_AMBULATORY_CARE_PROVIDER_SITE_OTHER): Payer: Medicaid Other | Admitting: *Deleted

## 2016-12-16 DIAGNOSIS — Z3201 Encounter for pregnancy test, result positive: Secondary | ICD-10-CM

## 2016-12-16 DIAGNOSIS — Z3200 Encounter for pregnancy test, result unknown: Secondary | ICD-10-CM

## 2016-12-16 NOTE — Progress Notes (Signed)
Pt informed of +UPT today.  LMP 11/13/16.  EDD 08/20/17. Pt is undecided as to whether or not she will keep the pregnancy. Medication reconciliation completed.

## 2016-12-24 MED FILL — metroNIDAZOLE 500 MG TABS: 500 | 7 days supply | Qty: 14 | Fill #0

## 2016-12-31 ENCOUNTER — Encounter: Payer: Self-pay | Admitting: Obstetrics & Gynecology

## 2017-01-26 ENCOUNTER — Encounter (HOSPITAL_COMMUNITY): Payer: Self-pay

## 2017-01-26 ENCOUNTER — Inpatient Hospital Stay (HOSPITAL_COMMUNITY): Payer: Medicaid Other

## 2017-01-26 ENCOUNTER — Inpatient Hospital Stay (HOSPITAL_COMMUNITY)
Admission: AD | Admit: 2017-01-26 | Discharge: 2017-01-26 | Disposition: A | Discharge status: Home / Self Care | Payer: Medicaid Other | Source: Ambulatory Visit | Attending: Obstetrics and Gynecology | Admitting: Obstetrics and Gynecology

## 2017-01-26 DIAGNOSIS — Z3490 Encounter for supervision of normal pregnancy, unspecified, unspecified trimester: Secondary | ICD-10-CM

## 2017-01-26 DIAGNOSIS — Z87891 Personal history of nicotine dependence: Secondary | ICD-10-CM | POA: Diagnosis not present

## 2017-01-26 DIAGNOSIS — R109 Unspecified abdominal pain: Secondary | ICD-10-CM | POA: Diagnosis present

## 2017-01-26 DIAGNOSIS — IMO0002 Reserved for concepts with insufficient information to code with codable children: Secondary | ICD-10-CM

## 2017-01-26 DIAGNOSIS — O26891 Other specified pregnancy related conditions, first trimester: Secondary | ICD-10-CM | POA: Diagnosis present

## 2017-01-26 DIAGNOSIS — O021 Missed abortion: Secondary | ICD-10-CM | POA: Diagnosis not present

## 2017-01-26 DIAGNOSIS — O26899 Other specified pregnancy related conditions, unspecified trimester: Secondary | ICD-10-CM

## 2017-01-26 LAB — URINALYSIS, ROUTINE W REFLEX MICROSCOPIC
Bilirubin Urine: NEGATIVE
GLUCOSE, UA: NEGATIVE mg/dL
Hgb urine dipstick: NEGATIVE
KETONES UR: NEGATIVE mg/dL
Nitrite: NEGATIVE
PH: 7 (ref 5.0–8.0)
Protein, ur: NEGATIVE mg/dL
SPECIFIC GRAVITY, URINE: 1.02 (ref 1.005–1.030)

## 2017-01-26 LAB — CBC
HEMATOCRIT: 34.7 % — AB (ref 36.0–46.0)
Hemoglobin: 11.8 g/dL — ABNORMAL LOW (ref 12.0–15.0)
MCH: 28.9 pg (ref 26.0–34.0)
MCHC: 34 g/dL (ref 30.0–36.0)
MCV: 84.8 fL (ref 78.0–100.0)
Platelets: 189 10*3/uL (ref 150–400)
RBC: 4.09 MIL/uL (ref 3.87–5.11)
RDW: 17.2 % — ABNORMAL HIGH (ref 11.5–15.5)
WBC: 5.5 10*3/uL (ref 4.0–10.5)

## 2017-01-26 LAB — ABO/RH: ABO/RH(D): B POS

## 2017-01-26 LAB — HCG, QUANTITATIVE, PREGNANCY: hCG, Beta Chain, Quant, S: 16400 m[IU]/mL — ABNORMAL HIGH (ref <5)

## 2017-01-26 LAB — POCT PREGNANCY, URINE: Preg Test, Ur: POSITIVE — AB

## 2017-01-26 MED ORDER — OXYCODONE-ACETAMINOPHEN 5-325 MG PO TABS: 1.0000 | ORAL_TABLET | Freq: Four times a day (QID) | ORAL | 0 refills | Status: DC | PRN

## 2017-01-26 MED ORDER — IBUPROFEN 600 MG PO TABS: 600.0000 mg | ORAL_TABLET | Freq: Four times a day (QID) | ORAL | 0 refills | Status: DC | PRN

## 2017-01-26 NOTE — MAU Provider Note (Signed)
Chief Complaint: Abdominal Pain   SUBJECTIVE HPI: Sydney Henry is a 26 y.o. G3P1011 at [redacted]w[redacted]d who presents to [facility] Admissions reporting abdominal pain.  3 weeks of abdominal pain, not always daily. Began as sharp pain for a few days, but then became more pressure feeling and more constant the past few days. Feels like a lot of pressure in the bladder area, but denies urinary symptoms of dysuria or frequency or urgency. Nothing provokes the pain, it happens randomly. Denies F/C. Denies VB. Just increased amounts of normal discharge. Has had a TAB with first pregnancy, the second pregnancy was induced at 37 5/7 weeks due to gestation HTN that developed. Found out she was pregnant 12/16/16, with LMP of 11/13/16 but unsure (used an app, but thinks it was around that time). Has h/o +CT in 2014, nothing since.    Past Medical History:  Diagnosis Date  . Hypertension    during pregnancy  . Vaginal delivery 2013   OB History  Gravida Para Term Preterm AB Living  3 1 1   1 1   SAB TAB Ectopic Multiple Live Births    1     1    # Outcome Date GA Lbr Len/2nd Weight Sex Delivery Anes PTL Lv  3 Current           2 Term 10/23/11 [redacted]w[redacted]d 20:27 / 00:15 6 lb 15.5 oz (3.161 kg) F Vag-Spont EPI  LIV     Birth Comments: WNL  1 TAB 02/13/08             Birth Comments: No complications     Past Surgical History:  Procedure Laterality Date  . DILATION AND CURETTAGE OF UTERUS    . INDUCED ABORTION    . IUD REMOVAL N/A 01/31/2015   Procedure: INTRAUTERINE DEVICE (IUD) REMOVAL;  Surgeon: Allie Bossier, MD;  Location: [facility];  Service: Gynecology;  Laterality: N/A;   Social History   Social History  . Marital status: Single    Spouse name: N/A  . Number of children: N/A  . Years of education: N/A   Occupational History  . Not on file.   Social History Main Topics  . Smoking status: Former Smoker    Packs/day: 0.25    Years: 0.50    Types: Cigarettes    Quit date: 12/23/2016  . Smokeless tobacco:  Never Used  . Alcohol use No  . Drug use: No  . Sexual activity: Yes    Birth control/ protection: None   Other Topics Concern  . Not on file   Social History Narrative  . No narrative on file   No current facility-administered medications on file prior to encounter.    Current Outpatient Prescriptions on File Prior to Encounter  Medication Sig Dispense Refill  . [DISCONTINUED] promethazine (PHENERGAN) 25 MG tablet Take 0.5-1 tablets (12.5-25 mg total) by mouth every 6 (six) hours as needed for nausea or vomiting. (Patient not taking: Reported on 09/25/2014) 30 tablet 0   Allergies  Allergen Reactions  . Other Hives and Itching    After getting epidural pt had a reaction, she is not sure which component caused the reactions     I have reviewed the past Medical Hx, Surgical Hx, Social Hx, Allergies and Medications.   REVIEW OF SYSTEMS  A comprehensive ROS was negative except per HPI.   OBJECTIVE Patient Vitals for the past 24 hrs:  BP Temp Pulse Resp Height Weight  01/26/17 1639 123/75 98.5 F (  36.9 C) 67 18 5\' 8"  (1.727 m) 159 lb (72.1 kg)    PHYSICAL EXAM Constitutional: Well-developed, well-nourished female in no acute distress.  Cardiovascular: normal rate, rhythm, no murmurs Respiratory: normal rate and effort. CTAB GI: Abd soft, mildly tender to lower midline pelvic region, non-distended. Pos BS x 4 MS: Extremities nontender, no edema, normal ROM Neurologic: Alert and oriented x 4.  GU: Neg CVAT.    LAB RESULTS Results for orders placed or performed during the hospital encounter of 01/26/17 (from the past 24 hour(s))  Urinalysis, Routine w reflex microscopic     Status: Abnormal   Collection Time: 01/26/17  4:32 PM  Result Value Ref Range   Color, Urine YELLOW YELLOW   APPearance HAZY (A) CLEAR   Specific Gravity, Urine 1.020 1.005 - 1.030   pH 7.0 5.0 - 8.0   Glucose, UA NEGATIVE NEGATIVE mg/dL   Hgb urine dipstick NEGATIVE NEGATIVE   Bilirubin Urine  NEGATIVE NEGATIVE   Ketones, ur NEGATIVE NEGATIVE mg/dL   Protein, ur NEGATIVE NEGATIVE mg/dL   Nitrite NEGATIVE NEGATIVE   Leukocytes, UA MODERATE (A) NEGATIVE   RBC / HPF 0-5 0 - 5 RBC/hpf   WBC, UA 0-5 0 - 5 WBC/hpf   Bacteria, UA RARE (A) NONE SEEN   Squamous Epithelial / LPF 6-30 (A) NONE SEEN   Mucous PRESENT   Pregnancy, urine POC     Status: Abnormal   Collection Time: 01/26/17  4:49 PM  Result Value Ref Range   Preg Test, Ur POSITIVE (A) NEGATIVE    IMAGING [place] Ob Comp Less 14 Wks  Result Date: 01/26/2017 CLINICAL DATA:  Cramping and pressure x3 weeks. Beta HCG is pending. No fetal heart tones heard. EXAM: OBSTETRIC <14 WK US AND TRANSVAGINAL OB US TECHNIQUE: Both transabdominal and transvaginal ultrasound examinations were performed for complete evaluation of the gestation as well as the maternal uterus, adnexal regions, and pelvic cul-de-sac. Transvaginal technique was performed to assess early pregnancy. COMPARISON:  None. FINDINGS: Intrauterine gestational sac: Single Yolk sac:  Not Visualized. Embryo:  Visualized. Cardiac Activity: Not Visualized. Heart Rate: 0  bpm CRL:  27.1  mm   9 w   4 d                  US EDC: 08/27/2017 Subchorionic hemorrhage:  None visualized. Maternal uterus/adnexae: Unremarkable bilateral ovaries IMPRESSION: Findings meet definitive criteria for failed pregnancy. No fetal cardiac activity for this 9 week 4 day gestation. This follows SRU consensus guidelines: Diagnostic Criteria for Nonviable Pregnancy Early in the First Trimester. Macy Mis Engl J Med [phone];369:1443-51. Electronically Signed   By: Tollie Ethavid  Kwon M.D.   On: 01/26/2017 18:19    MAU COURSE TVUS -   CBC ABO BHCG UA   MDM Plan of care reviewed with patient, including labs and tests ordered and medical treatment.  Discussed option for expectant management, medical management, and procedural management of missed AB including risks/benefits of each. Patient elected for procedural management.  Message sent to office to schedule patient for D&E and follow up afterwards.  Precautions given for bleeding and pain.    ASSESSMENT 1. Missed abortion   2. Early stage of pregnancy   3. Absent fetal heart tones   4. Abdominal pain affecting pregnancy     PLAN Discharge home in stable condition. Percocet, IBuprofen given Bleeding precautions given   Allergies as of 01/26/2017      Reactions   Other Hives, Itching   After getting epidural  pt had a reaction, she is not sure which component caused the reactions       Medication List    TAKE these medications   ibuprofen 600 MG tablet Commonly known as:  ADVIL,MOTRIN Take 1 tablet (600 mg total) by mouth every 6 (six) hours as needed for cramping (severe pain).   oxyCODONE-acetaminophen 5-325 MG tablet Commonly known as:  ROXICET Take 1 tablet by mouth every 6 (six) hours as needed.   prenatal multivitamin Tabs tablet Take 1 tablet by mouth daily at 12 noon.        Jen Mow, DO OB Fellow 01/26/2017 5:15 PM

## 2017-01-26 NOTE — MAU Note (Signed)
Pt reports she has been feeling pressure and pain for the last 3 weeks; denies bleeding; has been having diarrhea;

## 2017-01-26 NOTE — Discharge Instructions (Signed)

## 2017-01-27 ENCOUNTER — Telehealth (HOSPITAL_COMMUNITY): Payer: Self-pay

## 2017-01-27 ENCOUNTER — Encounter (HOSPITAL_COMMUNITY): Payer: Self-pay | Admitting: *Deleted

## 2017-01-27 MED ORDER — DOXYCYCLINE HYCLATE 100 MG IV SOLR
200.0000 mg | Freq: Once | INTRAVENOUS | Status: AC
  Administered 2017-01-28: 200 mg via INTRAVENOUS
  Filled 2017-01-27: qty 200

## 2017-01-27 MED FILL — OXYCODONE W/APAP 5/325 TAB: 5-325 | 2 days supply | Qty: 8 | Fill #0

## 2017-01-27 MED FILL — IBUPROFEN 600 MG TABLET: 600 | 8 days supply | Qty: 30 | Fill #0

## 2017-01-27 NOTE — Telephone Encounter (Signed)
-----   Message from [facility] Woodland Mumaw, DO sent at 01/26/2017  7:53 PM EDT ----- Regarding: Surgery Appt Set up for D&E Patient was seen in MAU today, diagnosed with missed AB at 1434w4d by US. Patient elected for D&E procedure. Please schedule an OR time for her, and call the patient to let her know when that will be.  THANK YOU!  Dr. Omer JackMumaw

## 2017-01-27 NOTE — Telephone Encounter (Signed)
Called and spoke w/ Sydney Henry, gave her surgery date and time. Advised her that Pre-Op should be calling her later on today. Patient expressed understanding.

## 2017-01-28 ENCOUNTER — Ambulatory Visit (HOSPITAL_COMMUNITY): Payer: Medicaid Other | Admitting: Anesthesiology

## 2017-01-28 ENCOUNTER — Ambulatory Visit (HOSPITAL_COMMUNITY)
Admission: RE | Admit: 2017-01-28 | Discharge: 2017-01-28 | Disposition: A | Discharge status: Home / Self Care | Payer: Medicaid Other | Source: Ambulatory Visit | Attending: Family Medicine | Admitting: Family Medicine

## 2017-01-28 ENCOUNTER — Encounter (HOSPITAL_COMMUNITY): Payer: Self-pay

## 2017-01-28 ENCOUNTER — Encounter (HOSPITAL_COMMUNITY)
Admission: RE | Disposition: A | Discharge status: Home / Self Care | Payer: Self-pay | Source: Ambulatory Visit | Attending: Family Medicine

## 2017-01-28 DIAGNOSIS — O161 Unspecified maternal hypertension, first trimester: Secondary | ICD-10-CM | POA: Diagnosis not present

## 2017-01-28 DIAGNOSIS — Z87891 Personal history of nicotine dependence: Secondary | ICD-10-CM | POA: Insufficient documentation

## 2017-01-28 DIAGNOSIS — Z3A11 11 weeks gestation of pregnancy: Secondary | ICD-10-CM | POA: Insufficient documentation

## 2017-01-28 DIAGNOSIS — O021 Missed abortion: Secondary | ICD-10-CM | POA: Diagnosis not present

## 2017-01-28 HISTORY — PX: DILATION AND EVACUATION: SHX1459

## 2017-01-28 HISTORY — DX: Unspecified asthma, uncomplicated: J45.909

## 2017-01-28 LAB — TYPE AND SCREEN
ABO/RH(D): B POS
Antibody Screen: NEGATIVE

## 2017-01-28 SURGERY — DILATION AND EVACUATION, UTERUS
Anesthesia: Monitor Anesthesia Care | Site: Vagina

## 2017-01-28 MED ORDER — ACETAMINOPHEN 325 MG PO TABS: 325.0000 mg | ORAL_TABLET | ORAL | Status: DC | PRN

## 2017-01-28 MED ORDER — PROPOFOL 10 MG/ML IV BOLUS
INTRAVENOUS | Status: AC
  Filled 2017-01-28: qty 20

## 2017-01-28 MED ORDER — FENTANYL CITRATE (PF) 100 MCG/2ML IJ SOLN
INTRAMUSCULAR | Status: DC | PRN
  Administered 2017-01-28 (×2): 50 ug via INTRAVENOUS

## 2017-01-28 MED ORDER — DEXAMETHASONE SODIUM PHOSPHATE 4 MG/ML IJ SOLN
INTRAMUSCULAR | Status: AC
  Filled 2017-01-28: qty 1

## 2017-01-28 MED ORDER — OXYCODONE HCL 5 MG/5ML PO SOLN: 5.0000 mg | Freq: Once | ORAL | Status: DC | PRN

## 2017-01-28 MED ORDER — IBUPROFEN 600 MG PO TABS: 600.0000 mg | ORAL_TABLET | Freq: Four times a day (QID) | ORAL | 0 refills | Status: DC | PRN

## 2017-01-28 MED ORDER — LACTATED RINGERS IV SOLN
INTRAVENOUS | Status: DC
  Administered 2017-01-28: 14:00:00 via INTRAVENOUS

## 2017-01-28 MED ORDER — ONDANSETRON HCL 4 MG/2ML IJ SOLN
INTRAMUSCULAR | Status: DC | PRN
  Administered 2017-01-28: 4 mg via INTRAVENOUS

## 2017-01-28 MED ORDER — SCOPOLAMINE 1 MG/3DAYS TD PT72
1.0000 | MEDICATED_PATCH | Freq: Once | TRANSDERMAL | Status: DC
  Administered 2017-01-28: 1.5 mg via TRANSDERMAL

## 2017-01-28 MED ORDER — OXYCODONE-ACETAMINOPHEN 5-325 MG PO TABS: 1.0000 | ORAL_TABLET | Freq: Four times a day (QID) | ORAL | 0 refills | Status: DC | PRN

## 2017-01-28 MED ORDER — KETOROLAC TROMETHAMINE 30 MG/ML IJ SOLN
INTRAMUSCULAR | Status: DC | PRN
  Administered 2017-01-28: 30 mg via INTRAVENOUS

## 2017-01-28 MED ORDER — FENTANYL CITRATE (PF) 100 MCG/2ML IJ SOLN
INTRAMUSCULAR | Status: AC
  Filled 2017-01-28: qty 2

## 2017-01-28 MED ORDER — KETOROLAC TROMETHAMINE 30 MG/ML IJ SOLN
INTRAMUSCULAR | Status: AC
  Filled 2017-01-28: qty 1

## 2017-01-28 MED ORDER — LIDOCAINE HCL (CARDIAC) 20 MG/ML IV SOLN
INTRAVENOUS | Status: AC
  Filled 2017-01-28: qty 5

## 2017-01-28 MED ORDER — BUPIVACAINE-EPINEPHRINE (PF) 0.25% -1:200000 IJ SOLN
INTRAMUSCULAR | Status: AC
  Filled 2017-01-28: qty 30

## 2017-01-28 MED ORDER — DEXAMETHASONE SODIUM PHOSPHATE 10 MG/ML IJ SOLN
INTRAMUSCULAR | Status: DC | PRN
  Administered 2017-01-28: 4 mg via INTRAVENOUS

## 2017-01-28 MED ORDER — PROPOFOL 500 MG/50ML IV EMUL
INTRAVENOUS | Status: DC | PRN
  Administered 2017-01-28: 30 mg via INTRAVENOUS
  Administered 2017-01-28: 150 mg via INTRAVENOUS
  Administered 2017-01-28: 20 mg via INTRAVENOUS
  Administered 2017-01-28: 50 mg via INTRAVENOUS

## 2017-01-28 MED ORDER — MEPERIDINE HCL 25 MG/ML IJ SOLN: 6.2500 mg | INTRAMUSCULAR | Status: DC | PRN

## 2017-01-28 MED ORDER — OXYCODONE HCL 5 MG PO TABS: 5.0000 mg | ORAL_TABLET | Freq: Once | ORAL | Status: DC | PRN

## 2017-01-28 MED ORDER — FENTANYL CITRATE (PF) 100 MCG/2ML IJ SOLN: 25.0000 ug | INTRAMUSCULAR | Status: DC | PRN

## 2017-01-28 MED ORDER — BUPIVACAINE-EPINEPHRINE 0.25% -1:200000 IJ SOLN
INTRAMUSCULAR | Status: DC | PRN
  Administered 2017-01-28: 30 mL

## 2017-01-28 MED ORDER — KETOROLAC TROMETHAMINE 30 MG/ML IJ SOLN: 30.0000 mg | Freq: Once | INTRAMUSCULAR | Status: DC | PRN

## 2017-01-28 MED ORDER — LACTATED RINGERS IV SOLN
INTRAVENOUS | Status: DC
  Administered 2017-01-28: 125 mL/h via INTRAVENOUS

## 2017-01-28 MED ORDER — ONDANSETRON HCL 4 MG PO TABS: 4.0000 mg | ORAL_TABLET | Freq: Three times a day (TID) | ORAL | 1 refills | Status: DC | PRN

## 2017-01-28 MED ORDER — SCOPOLAMINE 1 MG/3DAYS TD PT72
MEDICATED_PATCH | TRANSDERMAL | Status: AC
  Administered 2017-01-28: 1.5 mg via TRANSDERMAL
  Filled 2017-01-28: qty 1

## 2017-01-28 MED ORDER — MIDAZOLAM HCL 2 MG/2ML IJ SOLN
INTRAMUSCULAR | Status: AC
  Filled 2017-01-28: qty 2

## 2017-01-28 MED ORDER — ACETAMINOPHEN 160 MG/5ML PO SOLN: 325.0000 mg | ORAL | Status: DC | PRN

## 2017-01-28 MED ORDER — LIDOCAINE HCL (CARDIAC) 20 MG/ML IV SOLN
INTRAVENOUS | Status: DC | PRN
  Administered 2017-01-28: 30 mg via INTRAVENOUS
  Administered 2017-01-28: 70 mg via INTRAVENOUS

## 2017-01-28 MED ORDER — MIDAZOLAM HCL 2 MG/2ML IJ SOLN
INTRAMUSCULAR | Status: DC | PRN
  Administered 2017-01-28: 2 mg via INTRAVENOUS

## 2017-01-28 MED ORDER — ONDANSETRON HCL 4 MG/2ML IJ SOLN: 4.0000 mg | Freq: Once | INTRAMUSCULAR | Status: DC | PRN

## 2017-01-28 MED ORDER — ONDANSETRON HCL 4 MG/2ML IJ SOLN
INTRAMUSCULAR | Status: AC
  Filled 2017-01-28: qty 2

## 2017-01-28 MED ORDER — MIDAZOLAM HCL 2 MG/2ML IJ SOLN: 0.5000 mg | Freq: Once | INTRAMUSCULAR | Status: DC

## 2017-01-28 SURGICAL SUPPLY — 18 items
CATH ROBINSON RED A/P 16FR (CATHETERS) ×3 IMPLANT
CLOTH BEACON ORANGE TIMEOUT ST (SAFETY) ×3 IMPLANT
DECANTER SPIKE VIAL GLASS SM (MISCELLANEOUS) ×3 IMPLANT
GLOVE BIOGEL PI IND STRL 7.0 (GLOVE) ×2 IMPLANT
GLOVE BIOGEL PI INDICATOR 7.0 (GLOVE) ×4
GLOVE ECLIPSE 7.0 STRL STRAW (GLOVE) ×6 IMPLANT
GOWN STRL REUS W/TWL LRG LVL3 (GOWN DISPOSABLE) ×9 IMPLANT
KIT BERKELEY 1ST TRIMESTER 3/8 (MISCELLANEOUS) ×3 IMPLANT
NS IRRIG 1000ML POUR BTL (IV SOLUTION) ×3 IMPLANT
PACK VAGINAL MINOR WOMEN LF (CUSTOM PROCEDURE TRAY) ×3 IMPLANT
PAD OB MATERNITY 4.3X12.25 (PERSONAL CARE ITEMS) ×3 IMPLANT
PAD PREP 24X48 CUFFED NSTRL (MISCELLANEOUS) ×3 IMPLANT
SET BERKELEY SUCTION TUBING (SUCTIONS) ×3 IMPLANT
TOWEL OR 17X24 6PK STRL BLUE (TOWEL DISPOSABLE) ×6 IMPLANT
VACURETTE 10 RIGID CVD (CANNULA) IMPLANT
VACURETTE 7MM CVD STRL WRAP (CANNULA) IMPLANT
VACURETTE 8 RIGID CVD (CANNULA) IMPLANT
VACURETTE 9 RIGID CVD (CANNULA) ×3 IMPLANT

## 2017-01-28 NOTE — Transfer of Care (Signed)
Immediate Anesthesia Transfer of Care Note  Patient: Sydney Henry  Procedure(s) Performed: Procedure(s): DILATATION AND EVACUATION (N/A)  Patient Location: PACU  Anesthesia Type:MAC  Level of Consciousness: sedated  Airway & Oxygen Therapy: Patient Spontanous Breathing and Patient connected to nasal cannula oxygen  Post-op Assessment: Report given to RN  Post vital signs: Reviewed and stable  Last Vitals:  Vitals:   01/28/17 1314  BP: 113/85  Pulse: 80  Resp: 20  Temp: 36.8 C    Last Pain:  Vitals:   01/28/17 1314  TempSrc: Oral      Patients Stated Pain Goal: 3 (19/62/22 9798)  Complications: No apparent anesthesia complications

## 2017-01-28 NOTE — Op Note (Signed)
Sydney RobinsAlika D Sigley PROCEDURE DATE: 01/28/2017  PREOPERATIVE DIAGNOSIS: 11 week missed abortion POSTOPERATIVE DIAGNOSIS: The same PROCEDURE:     Dilation and Evacuation SURGEON:  Dr. Karyl KinnierKim Newton ASSISTANT: Dr. Jen MowElizabeth Alexios Keown  INDICATIONS: 26 y.o. [id number] with MAB at [redacted] weeks gestation, with fetal size 9 weeks 4 days, needing surgical completion.  Risks of surgery were discussed with the patient including but not limited to: bleeding which may require transfusion; infection which may require antibiotics; injury to uterus or surrounding organs; need for additional procedures including laparotomy or laparoscopy; possibility of intrauterine scarring which may impair future fertility; and other postoperative/anesthesia complications. Written informed consent was obtained.    FINDINGS:  A 9 week size uterus, with 9 week 4 day fetus, moderate amounts of products of conception, specimen sent to pathology.  ANESTHESIA:    Monitored intravenous sedation, paracervical block. INTRAVENOUS FLUIDS:  600 ml of LR ESTIMATED BLOOD LOSS:  Less than 20 ml. URINE OUTPUT: 25 ml SPECIMENS:  Products of conception sent to pathology COMPLICATIONS:  None immediate.  PROCEDURE DETAILS:  The patient received intravenous Doxycycline while in the preoperative area.  She was then taken to the operating room where monitored intravenous sedation was administered and was found to be adequate.  After an adequate timeout was performed, she was placed in the dorsal lithotomy position and examined; then prepped and draped in the sterile manner.   Her bladder was catheterized for an unmeasured amount of clear, yellow urine. A vaginal speculum was then placed in the patient's vagina and a single tooth tenaculum was applied to the anterior lip of the cervix.  A paracervical block using 30 ml of 0.5% Marcaine was administered. The cervix was gently dilated to accommodate a 9 mm suction curette that was gently advanced to the uterine fundus.   The suction device was then activated and curette slowly rotated to clear the uterus of products of conception.  A sharp curettage was then performed to confirm complete emptying of the uterus. There was minimal bleeding noted and the tenaculum removed with good hemostasis noted.   All instruments were removed from the patient's vagina.  Sponge and instrument counts were correct times two  The patient tolerated the procedure well and was taken to the recovery area awake, and in stable condition.  The patient will be discharged to home as per PACU criteria.  Routine postoperative instructions given.  She was prescribed Percocet, Ibuprofen and Colace.  She will follow up in the clinic in 2 weeks for postoperative evaluation.   Cleda ClarksElizabeth W. Angell Pincock, DO  OB Fellow 01/28/2017 2:57 PM

## 2017-01-28 NOTE — Discharge Instructions (Signed)
Dilation and Curettage or Vacuum Curettage, Care After °These instructions give you information about caring for yourself after your procedure. Your doctor may also give you more specific instructions. Call your doctor if you have any problems or questions after your procedure. °Follow these instructions at home: °Activity °· Do not drive or use heavy machinery while taking prescription pain medicine. °· For 24 hours after your procedure, avoid driving. °· Take short walks often, followed by rest periods. Ask your doctor what activities are safe for you. After one or two days, you may be able to return to your normal activities. °· Do not lift anything that is heavier than 10 lb (4.5 kg) until your doctor approves. °· For at least 2 weeks, or as long as told by your doctor: °? Do not douche. °? Do not use tampons. °? Do not have sex. °General instructions °· Take over-the-counter and prescription medicines only as told by your doctor. This is very important if you take blood thinning medicine. °· Do not take baths, swim, or use a hot tub until your doctor approves. Take showers instead of baths. °· Wear compression stockings as told by your doctor. °· It is up to you to get the results of your procedure. Ask your doctor when your results will be ready. °· Keep all follow-up visits as told by your doctor. This is important. °Contact a doctor if: °· You have very bad cramps that get worse or do not get better with medicine. °· You have very bad pain in your belly (abdomen). °· You cannot drink fluids without throwing up (vomiting). °· You get pain in a different part of the area between your belly and thighs (pelvis). °· You have bad-smelling discharge from your vagina. °· You have a rash. °Get help right away if: °· You are bleeding a lot from your vagina. A lot of bleeding means soaking more than one sanitary pad in an hour, for 2 hours in a row. °· You have clumps of blood (blood clots) coming from your  vagina. °· You have a fever or chills. °· Your belly feels very tender or hard. °· You have chest pain. °· You have trouble breathing. °· You cough up blood. °· You feel dizzy. °· You feel light-headed. °· You pass out (faint). °· You have pain in your neck or shoulder area. °Summary °· Take short walks often, followed by rest periods. Ask your doctor what activities are safe for you. After one or two days, you may be able to return to your normal activities. °· Do not lift anything that is heavier than 10 lb (4.5 kg) until your doctor approves. °· Do not take baths, swim, or use a hot tub until your doctor approves. Take showers instead of baths. °· Contact your doctor if you have any symptoms of infection, like bad-smelling discharge from your vagina. °This information is not intended to replace advice given to you by your health care provider. Make sure you discuss any questions you have with your health care provider. °Document Released: 05/20/2008 Document Revised: 04/28/2016 Document Reviewed: 04/28/2016 °Elsevier Interactive Patient Education © 2017 [employer] ° °Post Anesthesia Home Care Instructions ° °Activity: °Get plenty of rest for the remainder of the day. A responsible individual must stay with you for 24 hours following the procedure.  °For the next 24 hours, DO NOT: °-Drive a car °-Operate machinery °-Drink alcoholic beverages °-Take any medication unless instructed by your physician °-Make any legal decisions or sign   important papers. ° °Meals: °Start with liquid foods such as gelatin or soup. Progress to regular foods as tolerated. Avoid greasy, spicy, heavy foods. If nausea and/or vomiting occur, drink only clear liquids until the nausea and/or vomiting subsides. Call your physician if vomiting continues. ° °Special Instructions/Symptoms: °Your throat may feel dry or sore from the anesthesia or the breathing tube placed in your throat during surgery. If this causes discomfort, gargle with warm  salt water. The discomfort should disappear within 24 hours. ° °If you had a scopolamine patch placed behind your ear for the management of post- operative nausea and/or vomiting: ° °1. The medication in the patch is effective for 72 hours, after which it should be removed.  Wrap patch in a tissue and discard in the trash. Wash hands thoroughly with soap and water. °2. You may remove the patch earlier than 72 hours if you experience unpleasant side effects which may include dry mouth, dizziness or visual disturbances. °3. Avoid touching the patch. Wash your hands with soap and water after contact with the patch. °  ° °

## 2017-01-28 NOTE — Anesthesia Preprocedure Evaluation (Signed)
Anesthesia Evaluation  Patient identified by MRN, date of birth, ID band Patient awake    Reviewed: Allergy & Precautions, H&P , NPO status , Patient's Chart, lab work & pertinent test results  Airway Mallampati: I  TM Distance: >3 FB Neck ROM: full    Dental no notable dental hx.    Pulmonary former smoker,    Pulmonary exam normal breath sounds clear to auscultation       Cardiovascular hypertension, Normal cardiovascular exam Rhythm:regular Rate:Normal     Neuro/Psych negative neurological ROS  negative psych ROS   GI/Hepatic negative GI ROS, Neg liver ROS,   Endo/Other  negative endocrine ROS  Renal/GU negative Renal ROS     Musculoskeletal negative musculoskeletal ROS (+)   Abdominal Normal abdominal exam  (+)   Peds  Hematology negative hematology ROS (+)   Anesthesia Other Findings   Reproductive/Obstetrics (+) Pregnancy                             Anesthesia Physical Anesthesia Plan  ASA: II  Anesthesia Plan: MAC   Post-op Pain Management:    Induction: Intravenous  PONV Risk Score and Plan: 2 and Ondansetron, Dexamethasone and Treatment may vary due to age  Airway Management Planned: Natural Airway, Simple Face Mask and Nasal Cannula  Additional Equipment:   Intra-op Plan:   Post-operative Plan:   Informed Consent: I have reviewed the patients History and Physical, chart, labs and discussed the procedure including the risks, benefits and alternatives for the proposed anesthesia with the patient or authorized representative who has indicated his/her understanding and acceptance.     Plan Discussed with: CRNA and Surgeon  Anesthesia Plan Comments:         Anesthesia Quick Evaluation

## 2017-01-28 NOTE — Anesthesia Postprocedure Evaluation (Signed)
Anesthesia Post Note  Patient: Sydney Henry  Procedure(s) Performed: Procedure(s) (LRB): DILATATION AND EVACUATION (N/A)     Patient location during evaluation: PACU Anesthesia Type: MAC Level of consciousness: awake Pain management: pain level controlled Vital Signs Assessment: post-procedure vital signs reviewed and stable Respiratory status: spontaneous breathing Cardiovascular status: stable Postop Assessment: no signs of nausea or vomiting Anesthetic complications: no    Last Vitals:  Vitals:   01/28/17 1515 01/28/17 1530  BP: 112/73 106/66  Pulse: (!) 55 (!) 52  Resp: 18 16  Temp:      Last Pain:  Vitals:   01/28/17 1314  TempSrc: Oral   Pain Goal: Patients Stated Pain Goal: 3 (01/28/17 1504)               [place]

## 2017-01-28 NOTE — H&P (Signed)
Subjective:    Patient is a 26 y.o. 233P1011 female at 2227w0d by LMP scheduled for D&E. Indications for procedure are missed abortion.  Patient had gone to [facility] 2 days ago for cramping, she had a known pregnancy, and found out she had a missed abortion after evaluation. Patient was given risks/benefits of expectant management, medical intervention, and procedural intervention for missed abortion. Ultimately the patient decided she wanted to have the procedure done.  Pertinent Gynecological History: Previous TAB.   Discussed Blood/Blood Products: yes  Menstrual History: OB History    Gravida Para Term Preterm AB Living   3 1 1   1 1    SAB TAB Ectopic Multiple Live Births     1     1      Patient's last menstrual period was 11/12/2016.    The following portions of the patient's history were reviewed and updated as appropriate: allergies, current medications, past family history, past medical history, past social history, past surgical history and problem list.  Review of Systems Pertinent items are noted in HPI.    Objective:    BP 113/85   Pulse 80   Temp 98.3 F (36.8 C) (Oral)   Resp 20   Ht 5\' 8"  (1.727 m)   Wt 160 lb (72.6 kg)   LMP 11/12/2016 Comment: approx [redacted] wks gestation  SpO2 100%   BMI 24.33 kg/m   General:   alert, cooperative, appears stated age and no distress  Skin:   normal  HEENT:  PERRLA  Lungs:   clear to auscultation bilaterally  Heart:   regular rate and rhythm, S1, S2 normal, no murmur, click, rub or gallop  Abdomen:  soft, non-tender; bowel sounds normal; no masses,  no organomegaly     Assessment:    1) Missed abortion, at 3527w0d EGA, fetal size is 4081w4d    Plan:   Dilation and evacuation of 6481w4d fetus, missed abortion.    Counseling: Procedure, risks, reasons, benefits and complications (including injury to bowel, bladder, major blood vessel, ureter, bleeding, possibility of transfusion, infection, or fistula formation) reviewed in detail.  Consent signed. Preop testing ordered. Instructions reviewed, including NPO after midnight.   Cleda ClarksElizabeth W. Latonya Nelon, DO  [facility]

## 2017-01-29 ENCOUNTER — Encounter (HOSPITAL_COMMUNITY): Payer: Self-pay | Admitting: Family Medicine

## 2017-02-12 ENCOUNTER — Ambulatory Visit: Payer: Medicaid Other | Admitting: Obstetrics and Gynecology

## 2017-02-12 ENCOUNTER — Telehealth: Payer: Self-pay | Admitting: *Deleted

## 2017-02-12 NOTE — Telephone Encounter (Signed)
Patient was scheduled for a post operative appointment today following a d&e. Pt is unable to make the appointment because of her work schedule. She reports that she is not bleeding, no fever and no pain. She does not wish to reschedule appointment at this time. I advised her that if she has any problems to call us for an appointment. Also told her that I would message her provider to see if they had any information to give her. Patient agreeable to this and had no further questions or concerns.

## 2017-02-18 ENCOUNTER — Encounter: Payer: Medicaid Other | Admitting: Student

## 2017-05-05 ENCOUNTER — Encounter (HOSPITAL_COMMUNITY): Payer: Self-pay

## 2017-05-05 ENCOUNTER — Emergency Department (HOSPITAL_COMMUNITY)
Admission: EM | Admit: 2017-05-05 | Discharge: 2017-05-05 | Disposition: A | Discharge status: Home / Self Care | Payer: Medicaid Other | Attending: Emergency Medicine | Admitting: Emergency Medicine

## 2017-05-05 ENCOUNTER — Emergency Department (HOSPITAL_COMMUNITY): Payer: Medicaid Other

## 2017-05-05 DIAGNOSIS — J45909 Unspecified asthma, uncomplicated: Secondary | ICD-10-CM | POA: Insufficient documentation

## 2017-05-05 DIAGNOSIS — Y999 Unspecified external cause status: Secondary | ICD-10-CM | POA: Insufficient documentation

## 2017-05-05 DIAGNOSIS — Z87891 Personal history of nicotine dependence: Secondary | ICD-10-CM | POA: Insufficient documentation

## 2017-05-05 DIAGNOSIS — Y9389 Activity, other specified: Secondary | ICD-10-CM | POA: Insufficient documentation

## 2017-05-05 DIAGNOSIS — Y92410 Unspecified street and highway as the place of occurrence of the external cause: Secondary | ICD-10-CM | POA: Insufficient documentation

## 2017-05-05 DIAGNOSIS — M549 Dorsalgia, unspecified: Secondary | ICD-10-CM | POA: Diagnosis not present

## 2017-05-05 DIAGNOSIS — I10 Essential (primary) hypertension: Secondary | ICD-10-CM | POA: Insufficient documentation

## 2017-05-05 DIAGNOSIS — T148XXA Other injury of unspecified body region, initial encounter: Secondary | ICD-10-CM

## 2017-05-05 LAB — I-STAT BETA HCG BLOOD, ED (MC, WL, AP ONLY)

## 2017-05-05 MED ORDER — METHOCARBAMOL 500 MG PO TABS
500.0000 mg | ORAL_TABLET | Freq: Once | ORAL | Status: AC
  Administered 2017-05-05: 500 mg via ORAL
  Filled 2017-05-05: qty 1

## 2017-05-05 MED ORDER — IBUPROFEN 600 MG PO TABS: 600.0000 mg | ORAL_TABLET | Freq: Four times a day (QID) | ORAL | 0 refills | Status: DC | PRN

## 2017-05-05 MED ORDER — CYCLOBENZAPRINE HCL 10 MG PO TABS: 10.0000 mg | ORAL_TABLET | Freq: Two times a day (BID) | ORAL | 0 refills | Status: DC | PRN
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
## 2017-05-05 NOTE — ED Notes (Signed)
Pt unable to give urine sample at this time, edpa informed

## 2017-05-05 NOTE — ED Notes (Signed)
Radiology called to get update on when they may be able to get patient over for xrays, Tessa in radiology advised that they are planning to get patient over next
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
## 2017-07-31 ENCOUNTER — Other Ambulatory Visit: Payer: Self-pay

## 2017-07-31 ENCOUNTER — Encounter (HOSPITAL_COMMUNITY): Payer: Self-pay | Admitting: *Deleted

## 2017-07-31 ENCOUNTER — Inpatient Hospital Stay (HOSPITAL_COMMUNITY): Payer: Medicaid Other

## 2017-07-31 ENCOUNTER — Inpatient Hospital Stay (HOSPITAL_COMMUNITY)
Admission: AD | Admit: 2017-07-31 | Discharge: 2017-07-31 | Disposition: A | Discharge status: Home / Self Care | Payer: Medicaid Other | Source: Ambulatory Visit | Attending: Family Medicine | Admitting: Family Medicine

## 2017-07-31 DIAGNOSIS — O26899 Other specified pregnancy related conditions, unspecified trimester: Secondary | ICD-10-CM

## 2017-07-31 DIAGNOSIS — R109 Unspecified abdominal pain: Secondary | ICD-10-CM | POA: Diagnosis not present

## 2017-07-31 DIAGNOSIS — R1030 Lower abdominal pain, unspecified: Secondary | ICD-10-CM | POA: Diagnosis present

## 2017-07-31 DIAGNOSIS — O26891 Other specified pregnancy related conditions, first trimester: Secondary | ICD-10-CM

## 2017-07-31 DIAGNOSIS — Z87891 Personal history of nicotine dependence: Secondary | ICD-10-CM | POA: Diagnosis not present

## 2017-07-31 DIAGNOSIS — Z3A01 Less than 8 weeks gestation of pregnancy: Secondary | ICD-10-CM | POA: Diagnosis not present

## 2017-07-31 DIAGNOSIS — Z3491 Encounter for supervision of normal pregnancy, unspecified, first trimester: Secondary | ICD-10-CM

## 2017-07-31 LAB — URINALYSIS, ROUTINE W REFLEX MICROSCOPIC
BILIRUBIN URINE: NEGATIVE
Glucose, UA: NEGATIVE mg/dL
Hgb urine dipstick: NEGATIVE
KETONES UR: NEGATIVE mg/dL
LEUKOCYTES UA: NEGATIVE
NITRITE: NEGATIVE
PROTEIN: NEGATIVE mg/dL
Specific Gravity, Urine: 1.003 — ABNORMAL LOW (ref 1.005–1.030)
pH: 7 (ref 5.0–8.0)

## 2017-07-31 LAB — CBC
HCT: 35.8 % — ABNORMAL LOW (ref 36.0–46.0)
Hemoglobin: 11.7 g/dL — ABNORMAL LOW (ref 12.0–15.0)
MCH: 29 pg (ref 26.0–34.0)
MCHC: 32.7 g/dL (ref 30.0–36.0)
MCV: 88.8 fL (ref 78.0–100.0)
PLATELETS: 196 10*3/uL (ref 150–400)
RBC: 4.03 MIL/uL (ref 3.87–5.11)
RDW: 14.2 % (ref 11.5–15.5)
WBC: 5.5 10*3/uL (ref 4.0–10.5)

## 2017-07-31 LAB — WET PREP, GENITAL
Clue Cells Wet Prep HPF POC: NONE SEEN
Sperm: NONE SEEN
TRICH WET PREP: NONE SEEN
YEAST WET PREP: NONE SEEN

## 2017-07-31 LAB — ABO/RH: ABO/RH(D): B POS

## 2017-07-31 LAB — HCG, QUANTITATIVE, PREGNANCY: HCG, BETA CHAIN, QUANT, S: 62892 m[IU]/mL — AB (ref <5)

## 2017-07-31 LAB — POCT PREGNANCY, URINE: Preg Test, Ur: POSITIVE — AB

## 2017-07-31 MED ORDER — PROMETHAZINE HCL 25 MG PO TABS: 25.0000 mg | ORAL_TABLET | Freq: Four times a day (QID) | ORAL | 1 refills | Status: DC | PRN

## 2017-07-31 MED ORDER — PROMETHAZINE HCL 25 MG PO TABS: 25.0000 mg | ORAL_TABLET | Freq: Four times a day (QID) | ORAL | 2 refills | Status: DC | PRN

## 2017-07-31 MED ORDER — PRENATAL 27-0.8 MG PO TABS: 1.0000 | ORAL_TABLET | Freq: Every day | ORAL | 9 refills | Status: DC

## 2017-07-31 MED ORDER — PRENATAL 27-0.8 MG PO TABS: 1.0000 | ORAL_TABLET | Freq: Every day | ORAL | 10 refills | Status: DC

## 2017-07-31 MED FILL — PROMETHAZINE 25 MG TABLET: 25 | 8 days supply | Qty: 30 | Fill #0

## 2017-07-31 NOTE — MAU Provider Note (Signed)
History     CSN: [id number]  Arrival date and time: 07/31/17 1029   First Provider Initiated Contact with Patient 07/31/17 1147     Chief Complaint  Patient presents with  . Possible Pregnancy  . Back Pain  . Pelvic Pain   HPI Sydney Henry is a 26 y.o. G4P1011 at [redacted]w[redacted]d who presents with lower abdominal and lower back pain for the last 2 weeks. She states it started abruptly and is cramping that she rates a 6/10 but has not tried anything for the pain. She denies any vaginal bleeding or discharge. She has a hx of a D&E in the summer and is concerned about a miscarriage. Her LMP was in October but she has had irregular periods since her miscarriage and did not take a pregnancy test before she came.   OB History    Gravida Para Term Preterm AB Living   4 1 1   1 1    SAB TAB Ectopic Multiple Live Births     1     1      Past Medical History:  Diagnosis Date  . Asthma    rarely uses inhaler - seasonal  . Hypertension    Hx 2013 pregnancy only  . Vaginal delivery 2013    Past Surgical History:  Procedure Laterality Date  . DILATION AND CURETTAGE OF UTERUS    . DILATION AND EVACUATION N/A 01/28/2017   Procedure: DILATATION AND EVACUATION;  Surgeon: Federico Flake, MD;  Location: [facility];  Service: Gynecology;  Laterality: N/A;  . INDUCED ABORTION    . IUD REMOVAL N/A 01/31/2015   Procedure: INTRAUTERINE DEVICE (IUD) REMOVAL;  Surgeon: Allie Bossier, MD;  Location: [facility];  Service: Gynecology;  Laterality: N/A;  . WISDOM TOOTH EXTRACTION      Family History  Problem Relation Age of Onset  . Diabetes Paternal Grandmother   . Hypertension Paternal Grandmother   . Cancer Paternal Grandmother   . Hypertension Father   . Diabetes Father   . Diabetes Maternal Aunt   . Hypertension Maternal Aunt   . Cancer Maternal Grandmother   . Diabetes Maternal Grandfather     Social History   Tobacco Use  . Smoking status: Former Smoker    Packs/day: 0.25    Years: 5.00     Pack years: 1.25    Types: Cigarettes    Last attempt to quit: 12/23/2016    Years since quitting: 0.6  . Smokeless tobacco: Never Used  Substance Use Topics  . Alcohol use: No    Frequency: Never    Comment: social but none since 11/2016  . Drug use: No    Comment: last use 01/28/17    Allergies:  Allergies  Allergen Reactions  . Other Hives, Itching and Other (See Comments)    After getting epidural pt had a reaction, she is not sure which component caused the reactions     Medications Prior to Admission  Medication Sig Dispense Refill Last Dose  . albuterol (PROVENTIL HFA;VENTOLIN HFA) 108 (90 Base) MCG/ACT inhaler Inhale 2 puffs into the lungs every 6 (six) hours as needed for wheezing or shortness of breath.   PRN  . cyclobenzaprine (FLEXERIL) 10 MG tablet Take 1 tablet (10 mg total) by mouth 2 (two) times daily as needed for muscle spasms. 20 tablet 0   . ibuprofen (ADVIL,MOTRIN) 600 MG tablet Take 1 tablet (600 mg total) by mouth every 6 (six) hours as needed. 30 tablet 0   .  ondansetron (ZOFRAN) 4 MG tablet Take 1 tablet (4 mg total) by mouth every 8 (eight) hours as needed for nausea or vomiting. (Patient not taking: Reported on 05/05/2017) 10 tablet 1 Completed Course at Unknown time  . oxyCODONE-acetaminophen (ROXICET) 5-325 MG tablet Take 1 tablet by mouth every 6 (six) hours as needed. (Patient not taking: Reported on 05/05/2017) 12 tablet 0 Completed Course at Unknown time  . VITAMIN E PO Take 1 capsule by mouth 3 (three) times a week.   Past Week at Unknown time    Review of Systems  Constitutional: Negative.  Negative for fatigue and fever.  HENT: Negative.   Respiratory: Negative.  Negative for shortness of breath.   Cardiovascular: Negative.  Negative for chest pain.  Gastrointestinal: Positive for abdominal pain. Negative for constipation, diarrhea, nausea and vomiting.  Genitourinary: Negative.  Negative for dysuria, vaginal bleeding and vaginal discharge.   Musculoskeletal: Positive for back pain.  Neurological: Negative.  Negative for dizziness and headaches.   Physical Exam   Blood pressure 130/81, pulse 84, temperature 98.4 F (36.9 C), temperature source Oral, resp. rate 20, height 5\' 8"  (1.727 m), weight 162 lb (73.5 kg), last menstrual period 06/11/2017, SpO2 100 %, unknown if currently breastfeeding.  Physical Exam  Nursing note and vitals reviewed. Constitutional: She is oriented to person, place, and time. She appears well-developed and well-nourished. No distress.  HENT:  Head: Normocephalic.  Eyes: Pupils are equal, round, and reactive to light.  Cardiovascular: Normal rate, regular rhythm and normal heart sounds.  Respiratory: Effort normal and breath sounds normal. No respiratory distress.  GI: Soft. Bowel sounds are normal. She exhibits no distension. There is no tenderness. There is no rebound and no guarding.  Genitourinary: No vaginal discharge found.  Neurological: She is alert and oriented to person, place, and time.  Skin: Skin is warm and dry.  Psychiatric: She has a normal mood and affect. Her behavior is normal. Judgment and thought content normal.   Pelvic exam: Cervix pink, visually closed, without lesion, scant white creamy discharge, vaginal walls and external genitalia normal Bimanual exam: Cervix 0/long/high, firm, anterior, neg CMT, uterus nontender, adnexa without tenderness, enlargement, or mass  Dilation: Closed Exam by:: Druscilla BrownieNeill, CNM   MAU Course  Procedures Results for orders placed or performed during the hospital encounter of 07/31/17 (from the past 24 hour(s))  Urinalysis, Routine w reflex microscopic     Status: Abnormal   Collection Time: 07/31/17 10:35 AM  Result Value Ref Range   Color, Urine STRAW (A) YELLOW   APPearance CLEAR CLEAR   Specific Gravity, Urine 1.003 (L) 1.005 - 1.030   pH 7.0 5.0 - 8.0   Glucose, UA NEGATIVE NEGATIVE mg/dL   Hgb urine dipstick NEGATIVE NEGATIVE   Bilirubin  Urine NEGATIVE NEGATIVE   Ketones, ur NEGATIVE NEGATIVE mg/dL   Protein, ur NEGATIVE NEGATIVE mg/dL   Nitrite NEGATIVE NEGATIVE   Leukocytes, UA NEGATIVE NEGATIVE  Pregnancy, urine POC     Status: Abnormal   Collection Time: 07/31/17 11:06 AM  Result Value Ref Range   Preg Test, Ur POSITIVE (A) NEGATIVE  CBC     Status: Abnormal   Collection Time: 07/31/17 11:46 AM  Result Value Ref Range   WBC 5.5 4.0 - 10.5 K/uL   RBC 4.03 3.87 - 5.11 MIL/uL   Hemoglobin 11.7 (L) 12.0 - 15.0 g/dL   HCT 09.835.8 (L) 11.936.0 - 14.746.0 %   MCV 88.8 78.0 - 100.0 fL   MCH 29.0 26.0 -  34.0 pg   MCHC 32.7 30.0 - 36.0 g/dL   RDW 09.814.2 11.911.5 - 14.715.5 %   Platelets 196 150 - 400 K/uL  hCG, quantitative, pregnancy     Status: Abnormal   Collection Time: 07/31/17 11:46 AM  Result Value Ref Range   hCG, Beta Chain, Quant, S [id number],[id number] (H) <5 mIU/mL  ABO/Rh     Status: None   Collection Time: 07/31/17 11:47 AM  Result Value Ref Range   ABO/RH(D) B POS   Wet prep, genital     Status: Abnormal   Collection Time: 07/31/17 11:59 AM  Result Value Ref Range   Yeast Wet Prep HPF POC NONE SEEN NONE SEEN   Trich, Wet Prep NONE SEEN NONE SEEN   Clue Cells Wet Prep HPF POC NONE SEEN NONE SEEN   WBC, Wet Prep HPF POC FEW (A) NONE SEEN   Sperm NONE SEEN    [place] Ob Comp Less 14 Wks  Result Date: 07/31/2017 CLINICAL DATA:  Abdominal pain in pregnancy. EXAM: OBSTETRIC <14 WK US AND TRANSVAGINAL OB US TECHNIQUE: Both transabdominal and transvaginal ultrasound examinations were performed for complete evaluation of the gestation as well as the maternal uterus, adnexal regions, and pelvic cul-de-sac. Transvaginal technique was performed to assess early pregnancy. COMPARISON:  01/26/2017 FINDINGS: Intrauterine gestational sac: Single Yolk sac:  Visualized. Embryo:  Visualized. Cardiac Activity: Visualized. Heart Rate: 120  bpm CRL:  8  mm   6 w   4 d                  US EDC: 03/22/2018 Subchorionic hemorrhage:  Small Maternal uterus/adnexae:  Right ovary: Normal Left ovary: Normal Other :The uterus appears retroverted. Free fluid:  None IMPRESSION: 1. Single living intrauterine gestation. The estimated gestational age is 6 weeks and 4 days. Electronically Signed   By: Signa Kellaylor  Stroud M.D.   On: 07/31/2017 13:24   [place] Ob Transvaginal  Result Date: 07/31/2017 CLINICAL DATA:  Abdominal pain in pregnancy. EXAM: OBSTETRIC <14 WK US AND TRANSVAGINAL OB US TECHNIQUE: Both transabdominal and transvaginal ultrasound examinations were performed for complete evaluation of the gestation as well as the maternal uterus, adnexal regions, and pelvic cul-de-sac. Transvaginal technique was performed to assess early pregnancy. COMPARISON:  01/26/2017 FINDINGS: Intrauterine gestational sac: Single Yolk sac:  Visualized. Embryo:  Visualized. Cardiac Activity: Visualized. Heart Rate: 120  bpm CRL:  8  mm   6 w   4 d                  US EDC: 03/22/2018 Subchorionic hemorrhage:  Small Maternal uterus/adnexae: Right ovary: Normal Left ovary: Normal Other :The uterus appears retroverted. Free fluid:  None IMPRESSION: 1. Single living intrauterine gestation. The estimated gestational age is 6 weeks and 4 days. Electronically Signed   By: Signa Kellaylor  Stroud M.D.   On: 07/31/2017 13:24   MDM UA, UPT CBC, HCG ABO/Rh- B Pos Wet prep and gc/chlamydia US OB Transvaginal US OB Comp Less 14 weeks- single IUP with HR of 120bpm and small subchorionic hemorrhage  Assessment and Plan   1. Normal IUP (intrauterine pregnancy) on prenatal ultrasound, first trimester   2. Abdominal pain affecting pregnancy    -Discharge home in stable condition -Rx for prenatal vitamins and promethazine sent to patient's pharmacy -Abdominal pain and bleeding precautions discussed -Patient advised to follow-up with OB of choice to start prenatal care ASAP -Patient may return to MAU as needed or if her condition were to change or  worsen  Rolm Bookbinder CNM 07/31/2017, 1:31 PM    Allergies as  of 07/31/2017      Reactions   Other Hives, Itching, Other (See Comments)   After getting epidural pt had a reaction, she is not sure which component caused the reactions       Medication List    STOP taking these medications   cyclobenzaprine 10 MG tablet Commonly known as:  FLEXERIL   ibuprofen 600 MG tablet Commonly known as:  ADVIL,MOTRIN   ondansetron 4 MG tablet Commonly known as:  ZOFRAN   oxyCODONE-acetaminophen 5-325 MG tablet Commonly known as:  ROXICET   VITAMIN E PO     TAKE these medications   albuterol 108 (90 Base) MCG/ACT inhaler Commonly known as:  PROVENTIL HFA;VENTOLIN HFA Inhale 2 puffs into the lungs every 6 (six) hours as needed for wheezing or shortness of breath.   multivitamin-prenatal 27-0.8 MG Tabs tablet Take 1 tablet by mouth daily at 12 noon.   promethazine 25 MG tablet Commonly known as:  PHENERGAN Take 1 tablet (25 mg total) by mouth every 6 (six) hours as needed for nausea or vomiting.

## 2017-07-31 NOTE — Discharge Instructions (Signed)
[facility]  ° ° °[facility]       Phone: [phone] ° °[facility] [place] Phone: [phone] ° °[facility] [place]  Phone: [phone] ° °[facility] [place]  Phone: [phone] ° °[facility] [place]  Phone: [phone] ° °[facility]       Phone: [phone] ° °[facility]    Phone: [phone]  ° °[facility] ([place]    Phone: [phone] ° °[facility]    Phone: [phone] ° °[facility]    Phone: [phone] ° °[facility]    Phone: [phone] ° °[facility]       Phone: [phone]  ° °[facility]  Phone: [phone] ° °[facility]    Phone: [phone] ° °[facility]   Phone: [phone] ° °[facility]      Phone: [phone] ° °[facility]    Phone: [phone] ° °Safe Medications in Pregnancy  ° °Acne: °Benzoyl Peroxide °Salicylic Acid ° °Backache/Headache: °Tylenol: 2 regular strength every 4 hours OR °             2 Extra strength every 6 hours ° °Colds/Coughs/Allergies: °Benadryl (alcohol free) 25 mg every 6 hours as needed °Breath right strips °Claritin °Cepacol throat lozenges °Chloraseptic throat spray °Cold-Eeze- up to three times per day °Cough drops, alcohol free °Flonase (by prescription only) °Guaifenesin °Mucinex °Robitussin DM (plain only, alcohol free) °Saline nasal spray/drops °Sudafed (pseudoephedrine) & Actifed ** use only after [redacted] weeks gestation and if you do not have high blood pressure °Tylenol °Vicks Vaporub °Zinc lozenges °Zyrtec  ° °Constipation: °Colace °Ducolax suppositories °Fleet enema °Glycerin suppositories °Metamucil °Milk of  magnesia °Miralax °Senokot °Smooth move tea ° °Diarrhea: °Kaopectate °Imodium A-D ° °*NO pepto Bismol ° °Hemorrhoids: °Anusol °Anusol HC °Preparation H °Tucks ° °Indigestion: °Tums °Maalox °Mylanta °Zantac  °Pepcid ° °Insomnia: °Benadryl (alcohol free) 25mg every 6 hours as needed °Tylenol PM °Unisom, no Gelcaps ° °Leg Cramps: °Tums °MagGel ° °Nausea/Vomiting:  °Bonine °Dramamine °Emetrol °Ginger extract °Sea bands °Meclizine  °Nausea medication to take during pregnancy:  °Unisom (doxylamine succinate 25 mg tablets) Take one tablet daily at bedtime. If symptoms are not adequately controlled, the dose can be increased to a maximum recommended dose of two tablets daily (1/2 tablet in the morning, 1/2 tablet mid-afternoon and one at bedtime). °Vitamin B6 100mg tablets. Take one tablet twice a day (up to 200 mg per day). ° °Skin Rashes: °Aveeno products °Benadryl cream or 25mg every 6 hours as needed °Calamine Lotion °1% cortisone cream ° °Yeast infection: °Gyne-lotrimin 7 °Monistat 7 ° ° °**If taking multiple medications, please check labels to avoid duplicating the same active ingredients °**take medication as directed on the label °** Do not exceed 4000 mg of tylenol in 24 hours °**Do not take medications that contain aspirin or ibuprofen ° ° ° ° °Abdominal Pain During Pregnancy °Abdominal pain is common in pregnancy. Most of the time, it does not cause harm. There are many causes of abdominal pain. Some causes are more serious than others and sometimes the cause is not known. Abdominal pain can be a sign that something is very wrong with the pregnancy or the pain may have nothing to do with the pregnancy. Always tell your health care provider if you have any abdominal pain. °  Follow these instructions at home: °· Do not have sex or put anything in your vagina until your symptoms go away completely. °· Watch your abdominal pain for any changes. °· Get plenty of rest until your pain improves. °· Drink enough fluid to  keep your urine clear or pale yellow. °· Take over-the-counter or prescription medicines only as told by your health care provider. °· Keep all follow-up visits as told by your health care provider. This is important. °Contact a health care provider if: °· You have a fever. °· Your pain gets worse or you have cramping. °· Your pain continues after resting. °Get help right away if: °· You are bleeding, leaking fluid, or passing tissue from the vagina. °· You have vomiting or diarrhea that does not go away. °· You have painful or bloody urination. °· You notice a decrease in your baby's movements. °· You feel very weak or faint. °· You have shortness of breath. °· You develop a severe headache with abdominal pain. °· You have abnormal vaginal discharge with abdominal pain. °This information is not intended to replace advice given to you by your health care provider. Make sure you discuss any questions you have with your health care provider. °Document Released: 08/11/2005 Document Revised: 05/22/2016 Document Reviewed: 03/10/2013 °Elsevier Interactive Patient Education © 2018 [employer] Inc[employer] ° °First Trimester of Pregnancy °The first trimester of pregnancy is from week 1 until the end of week 13 (months 1 through 3). A week after a sperm fertilizes an egg, the egg will implant on the wall of the uterus. This embryo will begin to develop into a baby. Genes from you and your partner will form the baby. The female genes will determine whether the baby will be a boy or a girl. At 6-8 weeks, the eyes and face will be formed, and the heartbeat can be seen on ultrasound. At the end of 12 weeks, all the baby's organs will be formed. °Now that you are pregnant, you will want to do everything you can to have a healthy baby. Two of the most important things are to get good prenatal care and to follow your health care provider's instructions. Prenatal care is all the medical care you receive before the baby's birth. This care will  help prevent, find, and treat any problems during the pregnancy and childbirth. °Body changes during your first trimester °Your body goes through many changes during pregnancy. The changes vary from woman to woman. °· You may gain or lose a couple of pounds at first. °· You may feel sick to your stomach (nauseous) and you may throw up (vomit). If the vomiting is uncontrollable, call your health care provider. °· You may tire easily. °· You may develop headaches that can be relieved by medicines. All medicines should be approved by your health care provider. °· You may urinate more often. Painful urination may mean you have a bladder infection. °· You may develop heartburn as a result of your pregnancy. °· You may develop constipation because certain hormones are causing the muscles that push stool through your intestines to slow down. °· You may develop hemorrhoids or swollen veins (varicose veins). °· Your breasts may begin to grow larger and become tender. Your nipples may stick out more, and the tissue that surrounds them (areola) may become darker. °· Your gums may bleed and may be sensitive to brushing and flossing. °· Dark spots or blotches (chloasma, mask of pregnancy) may develop on your face. This will likely   fade after the baby is born. °· Your menstrual periods will stop. °· You may have a loss of appetite. °· You may develop cravings for certain kinds of food. °· You may have changes in your emotions from day to day, such as being excited to be pregnant or being concerned that something may go wrong with the pregnancy and baby. °· You may have more vivid and strange dreams. °· You may have changes in your hair. These can include thickening of your hair, rapid growth, and changes in texture. Some women also have hair loss during or after pregnancy, or hair that feels dry or thin. Your hair will most likely return to normal after your baby is born. ° °What to expect at prenatal visits °During a routine  prenatal visit: °· You will be weighed to make sure you and the baby are growing normally. °· Your blood pressure will be taken. °· Your abdomen will be measured to track your baby's growth. °· The fetal heartbeat will be listened to between weeks 10 and 14 of your pregnancy. °· Test results from any previous visits will be discussed. ° °Your health care provider may ask you: °· How you are feeling. °· If you are feeling the baby move. °· If you have had any abnormal symptoms, such as leaking fluid, bleeding, severe headaches, or abdominal cramping. °· If you are using any tobacco products, including cigarettes, chewing tobacco, and electronic cigarettes. °· If you have any questions. ° °Other tests that may be performed during your first trimester include: °· Blood tests to find your blood type and to check for the presence of any previous infections. The tests will also be used to check for low iron levels (anemia) and protein on red blood cells (Rh antibodies). Depending on your risk factors, or if you previously had diabetes during pregnancy, you may have tests to check for high blood sugar that affects pregnant women (gestational diabetes). °· Urine tests to check for infections, diabetes, or protein in the urine. °· An ultrasound to confirm the proper growth and development of the baby. °· Fetal screens for spinal cord problems (spina bifida) and Down syndrome. °· HIV (human immunodeficiency virus) testing. Routine prenatal testing includes screening for HIV, unless you choose not to have this test. °· You may need other tests to make sure you and the baby are doing well. ° °Follow these instructions at home: °Medicines °· Follow your health care provider's instructions regarding medicine use. Specific medicines may be either safe or unsafe to take during pregnancy. °· Take a prenatal vitamin that contains at least 600 micrograms (mcg) of folic acid. °· If you develop constipation, try taking a stool softener  if your health care provider approves. °Eating and drinking °· Eat a balanced diet that includes fresh fruits and vegetables, whole grains, good sources of protein such as meat, eggs, or tofu, and low-fat dairy. Your health care provider will help you determine the amount of weight gain that is right for you. °· Avoid raw meat and uncooked cheese. These carry germs that can cause birth defects in the baby. °· Eating four or five small meals rather than three large meals a day may help relieve nausea and vomiting. If you start to feel nauseous, eating a few soda crackers can be helpful. Drinking liquids between meals, instead of during meals, also seems to help ease nausea and vomiting. °· Limit foods that are high in fat and processed sugars, such as fried and   sweet foods. °· To prevent constipation: °? Eat foods that are high in fiber, such as fresh fruits and vegetables, whole grains, and beans. °? Drink enough fluid to keep your urine clear or pale yellow. °Activity °· Exercise only as directed by your health care provider. Most women can continue their usual exercise routine during pregnancy. Try to exercise for 30 minutes at least 5 days a week. Exercising will help you: °? Control your weight. °? Stay in shape. °? Be prepared for labor and delivery. °· Experiencing pain or cramping in the lower abdomen or lower back is a good sign that you should stop exercising. Check with your health care provider before continuing with normal exercises. °· Try to avoid standing for long periods of time. Move your legs often if you must stand in one place for a long time. °· Avoid heavy lifting. °· Wear low-heeled shoes and practice good posture. °· You may continue to have sex unless your health care provider tells you not to. °Relieving pain and discomfort °· Wear a good support bra to relieve breast tenderness. °· Take warm sitz baths to soothe any pain or discomfort caused by hemorrhoids. Use hemorrhoid cream if your  health care provider approves. °· Rest with your legs elevated if you have leg cramps or low back pain. °· If you develop varicose veins in your legs, wear support hose. Elevate your feet for 15 minutes, 3-4 times a day. Limit salt in your diet. °Prenatal care °· Schedule your prenatal visits by the twelfth week of pregnancy. They are usually scheduled monthly at first, then more often in the last 2 months before delivery. °· Write down your questions. Take them to your prenatal visits. °· Keep all your prenatal visits as told by your health care provider. This is important. °Safety °· Wear your seat belt at all times when driving. °· Make a list of emergency phone numbers, including numbers for family, friends, the hospital, and police and fire departments. °General instructions °· Ask your health care provider for a referral to a local prenatal education class. Begin classes no later than the beginning of month 6 of your pregnancy. °· Ask for help if you have counseling or nutritional needs during pregnancy. Your health care provider can offer advice or refer you to specialists for help with various needs. °· Do not use hot tubs, steam rooms, or saunas. °· Do not douche or use tampons or scented sanitary pads. °· Do not cross your legs for long periods of time. °· Avoid cat litter boxes and soil used by cats. These carry germs that can cause birth defects in the baby and possibly loss of the fetus by miscarriage or stillbirth. °· Avoid all smoking, herbs, alcohol, and medicines not prescribed by your health care provider. Chemicals in these products affect the formation and growth of the baby. °· Do not use any products that contain nicotine or tobacco, such as cigarettes and e-cigarettes. If you need help quitting, ask your health care provider. You may receive counseling support and other resources to help you quit. °· Schedule a dentist appointment. At home, brush your teeth with a soft toothbrush and be gentle  when you floss. °Contact a health care provider if: °· You have dizziness. °· You have mild pelvic cramps, pelvic pressure, or nagging pain in the abdominal area. °· You have persistent nausea, vomiting, or diarrhea. °· You have a bad smelling vaginal discharge. °· You have pain when you urinate. °· You   notice increased swelling in your face, hands, legs, or ankles. °· You are exposed to fifth disease or chickenpox. °· You are exposed to German measles (rubella) and have never had it. °Get help right away if: °· You have a fever. °· You are leaking fluid from your vagina. °· You have spotting or bleeding from your vagina. °· You have severe abdominal cramping or pain. °· You have rapid weight gain or loss. °· You vomit blood or material that looks like coffee grounds. °· You develop a severe headache. °· You have shortness of breath. °· You have any kind of trauma, such as from a fall or a car accident. °Summary °· The first trimester of pregnancy is from week 1 until the end of week 13 (months 1 through 3). °· Your body goes through many changes during pregnancy. The changes vary from woman to woman. °· You will have routine prenatal visits. During those visits, your health care provider will examine you, discuss any test results you may have, and talk with you about how you are feeling. °This information is not intended to replace advice given to you by your health care provider. Make sure you discuss any questions you have with your health care provider. °Document Released: 08/05/2001 Document Revised: 07/23/2016 Document Reviewed: 07/23/2016 °Elsevier Interactive Patient Education © 2017 Elsevier Inc. ° °

## 2017-07-31 NOTE — MAU Note (Signed)
Pt presents with c/o lower back & pelvic that began approximately 2 weeks ago.  Reports has taken any pain meds, has used heat.  Pt states she has had dizzy spells recently and has felt faint, hasn't fainted.  Pt desires pregnancy testing, LMP October 2018.  Reports Hx of irregular periods.

## 2017-08-01 LAB — GC/CHLAMYDIA PROBE AMP (~~LOC~~) NOT AT ARMC
Chlamydia: NEGATIVE
Neisseria Gonorrhea: NEGATIVE

## 2017-08-23 ENCOUNTER — Inpatient Hospital Stay (HOSPITAL_COMMUNITY)
Admission: AD | Admit: 2017-08-23 | Discharge: 2017-08-23 | Disposition: A | Discharge status: Home / Self Care | Payer: BLUE CROSS/BLUE SHIELD | Source: Ambulatory Visit | Attending: Obstetrics & Gynecology | Admitting: Obstetrics & Gynecology

## 2017-08-23 ENCOUNTER — Inpatient Hospital Stay (HOSPITAL_COMMUNITY): Payer: BLUE CROSS/BLUE SHIELD

## 2017-08-23 ENCOUNTER — Other Ambulatory Visit: Payer: Self-pay

## 2017-08-23 ENCOUNTER — Encounter (HOSPITAL_COMMUNITY): Payer: Self-pay

## 2017-08-23 DIAGNOSIS — Z79899 Other long term (current) drug therapy: Secondary | ICD-10-CM | POA: Diagnosis not present

## 2017-08-23 DIAGNOSIS — O4690 Antepartum hemorrhage, unspecified, unspecified trimester: Secondary | ICD-10-CM

## 2017-08-23 DIAGNOSIS — Z3A10 10 weeks gestation of pregnancy: Secondary | ICD-10-CM | POA: Diagnosis not present

## 2017-08-23 DIAGNOSIS — J45909 Unspecified asthma, uncomplicated: Secondary | ICD-10-CM | POA: Insufficient documentation

## 2017-08-23 DIAGNOSIS — Z87891 Personal history of nicotine dependence: Secondary | ICD-10-CM | POA: Insufficient documentation

## 2017-08-23 DIAGNOSIS — O209 Hemorrhage in early pregnancy, unspecified: Secondary | ICD-10-CM | POA: Diagnosis not present

## 2017-08-23 DIAGNOSIS — O99511 Diseases of the respiratory system complicating pregnancy, first trimester: Secondary | ICD-10-CM | POA: Diagnosis not present

## 2017-08-23 DIAGNOSIS — O418X10 Other specified disorders of amniotic fluid and membranes, first trimester, not applicable or unspecified: Secondary | ICD-10-CM

## 2017-08-23 DIAGNOSIS — O468X1 Other antepartum hemorrhage, first trimester: Secondary | ICD-10-CM

## 2017-08-23 DIAGNOSIS — Z3491 Encounter for supervision of normal pregnancy, unspecified, first trimester: Secondary | ICD-10-CM

## 2017-08-23 LAB — WET PREP, GENITAL
Trich, Wet Prep: NONE SEEN
Yeast Wet Prep HPF POC: NONE SEEN

## 2017-08-23 NOTE — Progress Notes (Addendum)
G4P1 @ 10.[redacted] wksga. Presents to triage for vaginal bleeding that started this morning when waking up. Currently bleeding has stopped. Denies pain.   Hx of vaginal delivery at 37.[redacted] wksga.   1045: Pt to U/S via wheelchair taken by nurse tech.

## 2017-08-23 NOTE — MAU Provider Note (Signed)
Chief Complaint: Vaginal Bleeding   First Provider Initiated Contact with Patient 08/23/17 1014      SUBJECTIVE HPI: Sydney Henry is a 26 y.o. G4P1011 at 4079w3d by LMP who presents to maternity admissions reporting vaginal bleeding. She states that heavy vaginal bleeding started after intercourse last night. She denies currently bleeding, she denies abdominal pain and states "she woke up not feeling pregnant anymore because she does not have any symptoms of nausea or vomiting". She denies abdominal pain, vaginal itching/burning, urinary symptoms, h/a, dizziness, n/v, or fever/chills.    Past Medical History:  Diagnosis Date  . Asthma    rarely uses inhaler - seasonal  . Hypertension    Hx 2013 pregnancy only  . Vaginal delivery 2013   Past Surgical History:  Procedure Laterality Date  . DILATION AND CURETTAGE OF UTERUS    . DILATION AND EVACUATION N/A 01/28/2017   Procedure: DILATATION AND EVACUATION;  Surgeon: Federico FlakeNewton, Kimberly Niles, MD;  Location: [facility];  Service: Gynecology;  Laterality: N/A;  . INDUCED ABORTION    . IUD REMOVAL N/A 01/31/2015   Procedure: INTRAUTERINE DEVICE (IUD) REMOVAL;  Surgeon: Allie BossierMyra C Dove, MD;  Location: [facility];  Service: Gynecology;  Laterality: N/A;  . WISDOM TOOTH EXTRACTION     Social History   Socioeconomic History  . Marital status: Single    Spouse name: Not on file  . Number of children: Not on file  . Years of education: Not on file  . Highest education level: Not on file  Social Needs  . Financial resource strain: Not on file  . Food insecurity - worry: Not on file  . Food insecurity - inability: Not on file  . Transportation needs - medical: Not on file  . Transportation needs - non-medical: Not on file  Occupational History  . Not on file  Tobacco Use  . Smoking status: Former Smoker    Packs/day: 0.25    Years: 5.00    Pack years: 1.25    Types: Cigarettes    Last attempt to quit: 12/23/2016    Years since quitting: 0.6  .  Smokeless tobacco: Never Used  Substance and Sexual Activity  . Alcohol use: No    Frequency: Never    Comment: social but none since 11/2016  . Drug use: No    Comment: last use 01/28/17  . Sexual activity: Yes    Birth control/protection: None    Comment: approx [redacted] wks gestation per patient   Other Topics Concern  . Not on file  Social History Narrative  . Not on file   No current facility-administered medications on file prior to encounter.    Current Outpatient Medications on File Prior to Encounter  Medication Sig Dispense Refill  . Prenatal Vit-Fe Fumarate-FA (MULTIVITAMIN-PRENATAL) 27-0.8 MG TABS tablet Take 1 tablet by mouth daily at 12 noon. 30 each 10  . promethazine (PHENERGAN) 25 MG tablet Take 1 tablet (25 mg total) by mouth every 6 (six) hours as needed for nausea or vomiting. 30 tablet 2  . albuterol (PROVENTIL HFA;VENTOLIN HFA) 108 (90 Base) MCG/ACT inhaler Inhale 2 puffs into the lungs every 6 (six) hours as needed for wheezing or shortness of breath.     Allergies  Allergen Reactions  . Other Hives, Itching and Other (See Comments)    After getting epidural pt had a reaction, she is not sure which component caused the reactions     ROS:  Review of Systems  Constitutional: Negative.  Respiratory: Negative.   Cardiovascular: Negative.   Gastrointestinal: Negative.   Genitourinary: Positive for vaginal bleeding. Negative for difficulty urinating, dysuria, frequency, pelvic pain, urgency and vaginal pain.  Musculoskeletal: Negative.   Neurological: Negative.   Psychiatric/Behavioral: Negative.    I have reviewed patient's Past Medical Hx, Surgical Hx, Family Hx, Social Hx, medications and allergies.   Physical Exam   Patient Vitals for the past 24 hrs:  BP Temp Pulse Resp  08/23/17 0959 (!) 146/86 98.8 F (37.1 C) 78 18   Constitutional: Well-developed, well-nourished female in no acute distress.  Cardiovascular: normal rate Respiratory: normal  effort GI: Abd soft, non-tender. Pos BS x 4 MS: Extremities nontender, no edema, normal ROM Neurologic: Alert and oriented x 4.  GU: Neg CVAT.  PELVIC EXAM: Cervix pink, visually closed, without lesion, no bleeding noted from cervix or in vagina, vaginal walls and external genitalia normal Bimanual exam: Cervix 0/long/high, firm, anterior, neg CMT, uterus nontender, nonenlarged, adnexa without tenderness, enlargement, or mass  LAB RESULTS Results for orders placed or performed during the hospital encounter of 08/23/17 (from the past 24 hour(s))  Wet prep, genital     Status: Abnormal   Collection Time: 08/23/17 10:30 AM  Result Value Ref Range   Yeast Wet Prep HPF POC NONE SEEN NONE SEEN   Trich, Wet Prep NONE SEEN NONE SEEN   Clue Cells Wet Prep HPF POC PRESENT (A) NONE SEEN   WBC, Wet Prep HPF POC FEW (A) NONE SEEN   Sperm PRESENT     --/--/B POS (12/07 1147)  IMAGING [place] [facility] Transvaginal  Result Date: 08/23/2017 CLINICAL DATA:  Post coital bleeding. EXAM: TRANSVAGINAL OB ULTRASOUND TECHNIQUE: Transvaginal ultrasound was performed for complete evaluation of the gestation as well as the maternal uterus, adnexal regions, and pelvic cul-de-sac. COMPARISON:  07/31/2017 FINDINGS: Intrauterine gestational sac: Single Yolk sac:  Visualized Embryo:  Visualize Cardiac Activity: Visualize Heart Rate: 177 bpm CRL:   3.5  mm   10 w 2 d                  US EDC: 03/19/2018 Subchorionic hemorrhage:  Small Maternal uterus/adnexae: Right ovary: Normal Left ovary: Normal Other :None Free fluid:  None IMPRESSION: 1. Single living intrauterine gestation with an estimated gestational age of [redacted] weeks and 2 days. 2. Small subchorionic hemorrhage Electronically Signed   By: Signa Kellaylor  Stroud M.D.   On: 08/23/2017 11:09    MAU Management/MDM: Orders Placed This Encounter  Procedures  . Wet prep, genital  . US OB Transvaginal  . Discharge patient Discharge disposition: 01-Home or Self Care; Discharge patient  date: 08/23/2017    Pt discharged with strict bleeding precautions and pelvic rest instructions. Patient agrees with POC.  ASSESSMENT 1. Vaginal bleeding during pregnancy, antepartum   2. Bleeding in early pregnancy   3. Subchorionic hematoma in first trimester, single or unspecified fetus   4. Normal IUP (intrauterine pregnancy) on prenatal ultrasound, first trimester     PLAN Discharge home on pelvic rest  Follow up as scheduled for initial prenatal appointment January 7th  Return to MAU as needed for increased bleeding and/or abdominal pain    Allergies as of 08/23/2017      Reactions   Other Hives, Itching, Other (See Comments)   After getting epidural pt had a reaction, she is not sure which component caused the reactions       Medication List    TAKE these medications   albuterol 108 (90 Base)  MCG/ACT inhaler Commonly known as:  PROVENTIL HFA;VENTOLIN HFA Inhale 2 puffs into the lungs every 6 (six) hours as needed for wheezing or shortness of breath.   multivitamin-prenatal 27-0.8 MG Tabs tablet Take 1 tablet by mouth daily at 12 noon.   promethazine 25 MG tablet Commonly known as:  PHENERGAN Take 1 tablet (25 mg total) by mouth every 6 (six) hours as needed for nausea or vomiting.       Steward Drone  Certified Nurse-Midwife 08/23/2017  10:39 AM

## 2017-08-23 NOTE — MAU Note (Signed)
Last night had intercourse had a lot of bleeding after, none this morning, denies pain, patient very tearful.

## 2017-08-23 NOTE — Discharge Instructions (Signed)
Pelvic Rest °Pelvic rest may be recommended if: °· Your placenta is partially or completely covering the opening of your cervix (placenta previa). °· There is bleeding between the wall of the uterus and the amniotic sac in the first trimester of pregnancy (subchorionic hemorrhage). °· You went into labor too early (preterm labor). ° °Based on your overall health and the health of your baby, your health care provider will decide if pelvic rest is right for you. °How do I rest my pelvis? °For as long as told by your health care provider: °· Do not have sex, sexual stimulation, or an orgasm. °· Do not use tampons. Do not douche. Do not put anything in your vagina. °· Do not lift anything that is heavier than 10 lb (4.5 kg). °· Avoid activities that take a lot of effort (are strenuous). °· Avoid any activity in which your pelvic muscles could become strained. ° °When should I seek medical care? °Seek medical care if you have: °· Cramping pain in your lower abdomen. °· Vaginal discharge. °· A low, dull backache. °· Regular contractions. °· Uterine tightening. ° °When should I seek immediate medical care? °Seek immediate medical care if: °· You have vaginal bleeding and you are pregnant. ° °This information is not intended to replace advice given to you by your health care provider. Make sure you discuss any questions you have with your health care provider. °Document Released: 12/06/2010 Document Revised: 01/17/2016 Document Reviewed: 02/12/2015 °Elsevier Interactive Patient Education © 2018 [employer] ° °Vaginal Bleeding During Pregnancy, First Trimester °A small amount of bleeding (spotting) from the vagina is common in early pregnancy. Sometimes the bleeding is normal and is not a problem, and sometimes it is a sign of something serious. Be sure to tell your doctor about any bleeding from your vagina right away. °Follow these instructions at home: °· Watch your condition for any changes. °· Follow your doctor's  instructions about how active you can be. °· If you are on bed rest: °? You may need to stay in bed and only get up to use the bathroom. °? You may be allowed to do some activities. °? If you need help, make plans for someone to help you. °· Write down: °? The number of pads you use each day. °? How often you change pads. °? How soaked (saturated) your pads are. °· Do not use tampons. °· Do not douche. °· Do not have sex or orgasms until your doctor says it is okay. °· If you pass any tissue from your vagina, save the tissue so you can show it to your doctor. °· Only take medicines as told by your doctor. °· Do not take aspirin because it can make you bleed. °· Keep all follow-up visits as told by your doctor. °Contact a doctor if: °· You bleed from your vagina. °· You have cramps. °· You have labor pains. °· You have a fever that does not go away after you take medicine. °Get help right away if: °· You have very bad cramps in your back or belly (abdomen). °· You pass large clots or tissue from your vagina. °· You bleed more. °· You feel light-headed or weak. °· You pass out (faint). °· You have chills. °· You are leaking fluid or have a gush of fluid from your vagina. °· You pass out while pooping (having a bowel movement). °This information is not intended to replace advice given to you by your health care provider. Make sure   you discuss any questions you have with your health care provider. °Document Released: 12/26/2013 Document Revised: 01/17/2016 Document Reviewed: 04/18/2013 °Elsevier Interactive Patient Education © 2018 [employer] ° °

## 2017-08-24 LAB — GC/CHLAMYDIA PROBE AMP (~~LOC~~) NOT AT ARMC
Chlamydia: NEGATIVE
Neisseria Gonorrhea: NEGATIVE

## 2017-08-25 NOTE — L&D Delivery Note (Signed)
Patient is a 27 y.o. now [id number] s/p NSVD at 11056w0d, who was admitted for IOL for gHTN.  She progressed with augmentation (AROM, Pitocin) to complete and pushed minutes to deliver.  Cord clamping delayed by 1-3 minutes then clamped by me with supervision and cut by FOB.  Placenta intact and spontaneous, bleeding minimal.  Small L labial abrasion noted although well approximated and achieved hemostasis, did not require repair.   Delivery Note At 10:19 PM a viable female was delivered via Vaginal, Spontaneous (Presentation: LOA) in usual fashion.  APGAR: 7, 9; weight  pending.   Placenta status: intact.  Cord: 3V with the following complications: loose nuchal x1.  Cord pH: N/A  Anesthesia: Epidural Episiotomy: None Lacerations: L labial Suture Repair: None Est. Blood Loss (mL): 150  Mom to postpartum.  Baby to Couplet care / Skin to Skin.  Ellwood DenseAlison Rumball, DO 02/24/18, 10:57 PM

## 2017-08-31 ENCOUNTER — Encounter: Payer: Self-pay | Admitting: Obstetrics and Gynecology

## 2017-08-31 ENCOUNTER — Ambulatory Visit (INDEPENDENT_AMBULATORY_CARE_PROVIDER_SITE_OTHER): Payer: BLUE CROSS/BLUE SHIELD | Admitting: Obstetrics and Gynecology

## 2017-08-31 DIAGNOSIS — Z3481 Encounter for supervision of other normal pregnancy, first trimester: Secondary | ICD-10-CM

## 2017-08-31 DIAGNOSIS — Z3480 Encounter for supervision of other normal pregnancy, unspecified trimester: Secondary | ICD-10-CM | POA: Insufficient documentation

## 2017-08-31 DIAGNOSIS — O09291 Supervision of pregnancy with other poor reproductive or obstetric history, first trimester: Secondary | ICD-10-CM

## 2017-08-31 DIAGNOSIS — O09299 Supervision of pregnancy with other poor reproductive or obstetric history, unspecified trimester: Secondary | ICD-10-CM | POA: Insufficient documentation

## 2017-08-31 MED ORDER — ASPIRIN EC 81 MG PO TBEC: 81.0000 mg | DELAYED_RELEASE_TABLET | Freq: Every day | ORAL | 2 refills | Status: DC
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
## 2017-08-31 NOTE — Patient Instructions (Signed)
 Second Trimester of Pregnancy The second trimester is from week 14 through week 27 (months 4 through 6). The second trimester is often a time when you feel your best. Your body has adjusted to being pregnant, and you begin to feel better physically. Usually, morning sickness has lessened or quit completely, you may have more energy, and you may have an increase in appetite. The second trimester is also a time when the fetus is growing rapidly. At the end of the sixth month, the fetus is about 9 inches long and weighs about 1 pounds. You will likely begin to feel the baby move (quickening) between 16 and 20 weeks of pregnancy. Body changes during your second trimester Your body continues to go through many changes during your second trimester. The changes vary from woman to woman.  Your weight will continue to increase. You will notice your lower abdomen bulging out.  You may begin to get stretch marks on your hips, abdomen, and breasts.  You may develop headaches that can be relieved by medicines. The medicines should be approved by your health care provider.  You may urinate more often because the fetus is pressing on your bladder.  You may develop or continue to have heartburn as a result of your pregnancy.  You may develop constipation because certain hormones are causing the muscles that push waste through your intestines to slow down.  You may develop hemorrhoids or swollen, bulging veins (varicose veins).  You may have back pain. This is caused by: ? Weight gain. ? Pregnancy hormones that are relaxing the joints in your pelvis. ? A shift in weight and the muscles that support your balance.  Your breasts will continue to grow and they will continue to become tender.  Your gums may bleed and may be sensitive to brushing and flossing.  Dark spots or blotches (chloasma, mask of pregnancy) may develop on your face. This will likely fade after the baby is born.  A dark line from  your belly button to the pubic area (linea nigra) may appear. This will likely fade after the baby is born.  You may have changes in your hair. These can include thickening of your hair, rapid growth, and changes in texture. Some women also have hair loss during or after pregnancy, or hair that feels dry or thin. Your hair will most likely return to normal after your baby is born.  What to expect at prenatal visits During a routine prenatal visit:  You will be weighed to make sure you and the fetus are growing normally.  Your blood pressure will be taken.  Your abdomen will be measured to track your baby's growth.  The fetal heartbeat will be listened to.  Any test results from the previous visit will be discussed.  Your health care provider may ask you:  How you are feeling.  If you are feeling the baby move.  If you have had any abnormal symptoms, such as leaking fluid, bleeding, severe headaches, or abdominal cramping.  If you are using any tobacco products, including cigarettes, chewing tobacco, and electronic cigarettes.  If you have any questions.  Other tests that may be performed during your second trimester include:  Blood tests that check for: ? Low iron levels (anemia). ? High blood sugar that affects pregnant women (gestational diabetes) between 24 and 28 weeks. ? Rh antibodies. This is to check for a protein on red blood cells (Rh factor).  Urine tests to check for infections, diabetes,   or protein in the urine.  An ultrasound to confirm the proper growth and development of the baby.  An amniocentesis to check for possible genetic problems.  Fetal screens for spina bifida and Down syndrome.  HIV (human immunodeficiency virus) testing. Routine prenatal testing includes screening for HIV, unless you choose not to have this test.  Follow these instructions at home: Medicines  Follow your health care provider's instructions regarding medicine use. Specific  medicines may be either safe or unsafe to take during pregnancy.  Take a prenatal vitamin that contains at least 600 micrograms (mcg) of folic acid.  If you develop constipation, try taking a stool softener if your health care provider approves. Eating and drinking  Eat a balanced diet that includes fresh fruits and vegetables, whole grains, good sources of protein such as meat, eggs, or tofu, and low-fat dairy. Your health care provider will help you determine the amount of weight gain that is right for you.  Avoid raw meat and uncooked cheese. These carry germs that can cause birth defects in the baby.  If you have low calcium intake from food, talk to your health care provider about whether you should take a daily calcium supplement.  Limit foods that are high in fat and processed sugars, such as fried and sweet foods.  To prevent constipation: ? Drink enough fluid to keep your urine clear or pale yellow. ? Eat foods that are high in fiber, such as fresh fruits and vegetables, whole grains, and beans. Activity  Exercise only as directed by your health care provider. Most women can continue their usual exercise routine during pregnancy. Try to exercise for 30 minutes at least 5 days a week. Stop exercising if you experience uterine contractions.  Avoid heavy lifting, wear low heel shoes, and practice good posture.  A sexual relationship may be continued unless your health care provider directs you otherwise. Relieving pain and discomfort  Wear a good support bra to prevent discomfort from breast tenderness.  Take warm sitz baths to soothe any pain or discomfort caused by hemorrhoids. Use hemorrhoid cream if your health care provider approves.  Rest with your legs elevated if you have leg cramps or low back pain.  If you develop varicose veins, wear support hose. Elevate your feet for 15 minutes, 3-4 times a day. Limit salt in your diet. Prenatal Care  Write down your questions.  Take them to your prenatal visits.  Keep all your prenatal visits as told by your health care provider. This is important. Safety  Wear your seat belt at all times when driving.  Make a list of emergency phone numbers, including numbers for family, friends, the hospital, and police and fire departments. General instructions  Ask your health care provider for a referral to a local prenatal education class. Begin classes no later than the beginning of month 6 of your pregnancy.  Ask for help if you have counseling or nutritional needs during pregnancy. Your health care provider can offer advice or refer you to specialists for help with various needs.  Do not use hot tubs, steam rooms, or saunas.  Do not douche or use tampons or scented sanitary pads.  Do not cross your legs for long periods of time.  Avoid cat litter boxes and soil used by cats. These carry germs that can cause birth defects in the baby and possibly loss of the fetus by miscarriage or stillbirth.  Avoid all smoking, herbs, alcohol, and unprescribed drugs. Chemicals in these products   can affect the formation and growth of the baby.  Do not use any products that contain nicotine or tobacco, such as cigarettes and e-cigarettes. If you need help quitting, ask your health care provider.  Visit your dentist if you have not gone yet during your pregnancy. Use a soft toothbrush to brush your teeth and be gentle when you floss. Contact a health care provider if:  You have dizziness.  You have mild pelvic cramps, pelvic pressure, or nagging pain in the abdominal area.  You have persistent nausea, vomiting, or diarrhea.  You have a bad smelling vaginal discharge.  You have pain when you urinate. Get help right away if:  You have a fever.  You are leaking fluid from your vagina.  You have spotting or bleeding from your vagina.  You have severe abdominal cramping or pain.  You have rapid weight gain or weight  loss.  You have shortness of breath with chest pain.  You notice sudden or extreme swelling of your face, hands, ankles, feet, or legs.  You have not felt your baby move in over an hour.  You have severe headaches that do not go away when you take medicine.  You have vision changes. Summary  The second trimester is from week 14 through week 27 (months 4 through 6). It is also a time when the fetus is growing rapidly.  Your body goes through many changes during pregnancy. The changes vary from woman to woman.  Avoid all smoking, herbs, alcohol, and unprescribed drugs. These chemicals affect the formation and growth your baby.  Do not use any tobacco products, such as cigarettes, chewing tobacco, and e-cigarettes. If you need help quitting, ask your health care provider.  Contact your health care provider if you have any questions. Keep all prenatal visits as told by your health care provider. This is important. This information is not intended to replace advice given to you by your health care provider. Make sure you discuss any questions you have with your health care provider. Document Released: 08/05/2001 Document Revised: 09/16/2016 Document Reviewed: 09/16/2016 [employer] Interactive Patient Education  2018 [employer]   Contraception Choices Contraception, also called birth control, refers to methods or devices that prevent pregnancy. Hormonal methods Contraceptive implant A contraceptive implant is a thin, plastic tube that contains a hormone. It is inserted into the upper part of the arm. It can remain in place for up to 3 years. Progestin-only injections Progestin-only injections are injections of progestin, a synthetic form of the hormone progesterone. They are given every 3 months by a health care provider. Birth control pills Birth control pills are pills that contain hormones that prevent pregnancy. They must be taken once a day, preferably at the same time each  day. Birth control patch The birth control patch contains hormones that prevent pregnancy. It is placed on the skin and must be changed once a week for three weeks and removed on the fourth week. A prescription is needed to use this method of contraception. Vaginal ring A vaginal ring contains hormones that prevent pregnancy. It is placed in the vagina for three weeks and removed on the fourth week. After that, the process is repeated with a new ring. A prescription is needed to use this method of contraception. Emergency contraceptive Emergency contraceptives prevent pregnancy after unprotected sex. They come in pill form and can be taken up to 5 days after sex. They work best the sooner they are taken after having sex. Most emergency   contraceptives are available without a prescription. This method should not be used as your only form of birth control. Barrier methods Female condom A female condom is a thin sheath that is worn over the penis during sex. Condoms keep sperm from going inside a woman's body. They can be used with a spermicide to increase their effectiveness. They should be disposed after a single use. Female condom A female condom is a soft, loose-fitting sheath that is put into the vagina before sex. The condom keeps sperm from going inside a woman's body. They should be disposed after a single use. Diaphragm A diaphragm is a soft, dome-shaped barrier. It is inserted into the vagina before sex, along with a spermicide. The diaphragm blocks sperm from entering the uterus, and the spermicide kills sperm. A diaphragm should be left in the vagina for 6-8 hours after sex and removed within 24 hours. A diaphragm is prescribed and fitted by a health care provider. A diaphragm should be replaced every 1-2 years, after giving birth, after gaining more than 15 lb (6.8 kg), and after pelvic surgery. Cervical cap A cervical cap is a round, soft latex or plastic cup that fits over the cervix. It is  inserted into the vagina before sex, along with spermicide. It blocks sperm from entering the uterus. The cap should be left in place for 6-8 hours after sex and removed within 48 hours. A cervical cap must be prescribed and fitted by a health care provider. It should be replaced every 2 years. Sponge A sponge is a soft, circular piece of polyurethane foam with spermicide on it. The sponge helps block sperm from entering the uterus, and the spermicide kills sperm. To use it, you make it wet and then insert it into the vagina. It should be inserted before sex, left in for at least 6 hours after sex, and removed and thrown away within 30 hours. Spermicides Spermicides are chemicals that kill or block sperm from entering the cervix and uterus. They can come as a cream, jelly, suppository, foam, or tablet. A spermicide should be inserted into the vagina with an applicator at least 10-15 minutes before sex to allow time for it to work. The process must be repeated every time you have sex. Spermicides do not require a prescription. Intrauterine contraception Intrauterine device (IUD) An IUD is a T-shaped device that is put in a woman's uterus. There are two types:  Hormone IUD.This type contains progestin, a synthetic form of the hormone progesterone. This type can stay in place for 3-5 years.  Copper IUD.This type is wrapped in copper wire. It can stay in place for 10 years.  Permanent methods of contraception Female tubal ligation In this method, a woman's fallopian tubes are sealed, tied, or blocked during surgery to prevent eggs from traveling to the uterus. Hysteroscopic sterilization In this method, a small, flexible insert is placed into each fallopian tube. The inserts cause scar tissue to form in the fallopian tubes and block them, so sperm cannot reach an egg. The procedure takes about 3 months to be effective. Another form of birth control must be used during those 3 months. Female  sterilization This is a procedure to tie off the tubes that carry sperm (vasectomy). After the procedure, the man can still ejaculate fluid (semen). Natural planning methods Natural family planning In this method, a couple does not have sex on days when the woman could become pregnant. Calendar method This means keeping track of the length of each   menstrual cycle, identifying the days when pregnancy can happen, and not having sex on those days. Ovulation method In this method, a couple avoids sex during ovulation. Symptothermal method This method involves not having sex during ovulation. The woman typically checks for ovulation by watching changes in her temperature and in the consistency of cervical mucus. Post-ovulation method In this method, a couple waits to have sex until after ovulation. Summary  Contraception, also called birth control, means methods or devices that prevent pregnancy.  Hormonal methods of contraception include implants, injections, pills, patches, vaginal rings, and emergency contraceptives.  Barrier methods of contraception can include female condoms, female condoms, diaphragms, cervical caps, sponges, and spermicides.  There are two types of IUDs (intrauterine devices). An IUD can be put in a woman's uterus to prevent pregnancy for 3-5 years.  Permanent sterilization can be done through a procedure for males, females, or both.  Natural family planning methods involve not having sex on days when the woman could become pregnant. This information is not intended to replace advice given to you by your health care provider. Make sure you discuss any questions you have with your health care provider. Document Released: 08/11/2005 Document Revised: 09/13/2016 Document Reviewed: 09/13/2016 [employer] Interactive Patient Education  2018 [employer]   Breastfeeding Choosing to breastfeed is one of the best decisions you can make for yourself and your baby. A change in  hormones during pregnancy causes your breasts to make breast milk in your milk-producing glands. Hormones prevent breast milk from being released before your baby is born. They also prompt milk flow after birth. Once breastfeeding has begun, thoughts of your baby, as well as his or her sucking or crying, can stimulate the release of milk from your milk-producing glands. Benefits of breastfeeding Research shows that breastfeeding offers many health benefits for infants and mothers. It also offers a cost-free and convenient way to feed your baby. For your baby  Your first milk (colostrum) helps your baby's digestive system to function better.  Special cells in your milk (antibodies) help your baby to fight off infections.  Breastfed babies are less likely to develop asthma, allergies, obesity, or type 2 diabetes. They are also at lower risk for sudden infant death syndrome (SIDS).  Nutrients in breast milk are better able to meet your baby's needs compared to infant formula.  Breast milk improves your baby's brain development. For you  Breastfeeding helps to create a very special bond between you and your baby.  Breastfeeding is convenient. Breast milk costs nothing and is always available at the correct temperature.  Breastfeeding helps to burn calories. It helps you to lose the weight that you gained during pregnancy.  Breastfeeding makes your uterus return faster to its size before pregnancy. It also slows bleeding (lochia) after you give birth.  Breastfeeding helps to lower your risk of developing type 2 diabetes, osteoporosis, rheumatoid arthritis, cardiovascular disease, and breast, ovarian, uterine, and endometrial cancer later in life. Breastfeeding basics Starting breastfeeding  Find a comfortable place to sit or lie down, with your neck and back well-supported.  Place a pillow or a rolled-up blanket under your baby to bring him or her to the level of your breast (if you are  seated). Nursing pillows are specially designed to help support your arms and your baby while you breastfeed.  Make sure that your baby's tummy (abdomen) is facing your abdomen.  Gently massage your breast. With your fingertips, massage from the outer edges of your breast inward   toward the nipple. This encourages milk flow. If your milk flows slowly, you may need to continue this action during the feeding.  Support your breast with 4 fingers underneath and your thumb above your nipple (make the letter "C" with your hand). Make sure your fingers are well away from your nipple and your baby's mouth.  Stroke your baby's lips gently with your finger or nipple.  When your baby's mouth is open wide enough, quickly bring your baby to your breast, placing your entire nipple and as much of the areola as possible into your baby's mouth. The areola is the colored area around your nipple. ? More areola should be visible above your baby's upper lip than below the lower lip. ? Your baby's lips should be opened and extended outward (flanged) to ensure an adequate, comfortable latch. ? Your baby's tongue should be between his or her lower gum and your breast.  Make sure that your baby's mouth is correctly positioned around your nipple (latched). Your baby's lips should create a seal on your breast and be turned out (everted).  It is common for your baby to suck about 2-3 minutes in order to start the flow of breast milk. Latching Teaching your baby how to latch onto your breast properly is very important. An improper latch can cause nipple pain, decreased milk supply, and poor weight gain in your baby. Also, if your baby is not latched onto your nipple properly, he or she may swallow some air during feeding. This can make your baby fussy. Burping your baby when you switch breasts during the feeding can help to get rid of the air. However, teaching your baby to latch on properly is still the best way to prevent  fussiness from swallowing air while breastfeeding. Signs that your baby has successfully latched onto your nipple  Silent tugging or silent sucking, without causing you pain. Infant's lips should be extended outward (flanged).  Swallowing heard between every 3-4 sucks once your milk has started to flow (after your let-down milk reflex occurs).  Muscle movement above and in front of his or her ears while sucking.  Signs that your baby has not successfully latched onto your nipple  Sucking sounds or smacking sounds from your baby while breastfeeding.  Nipple pain.  If you think your baby has not latched on correctly, slip your finger into the corner of your baby's mouth to break the suction and place it between your baby's gums. Attempt to start breastfeeding again. Signs of successful breastfeeding Signs from your baby  Your baby will gradually decrease the number of sucks or will completely stop sucking.  Your baby will fall asleep.  Your baby's body will relax.  Your baby will retain a small amount of milk in his or her mouth.  Your baby will let go of your breast by himself or herself.  Signs from you  Breasts that have increased in firmness, weight, and size 1-3 hours after feeding.  Breasts that are softer immediately after breastfeeding.  Increased milk volume, as well as a change in milk consistency and color by the fifth day of breastfeeding.  Nipples that are not sore, cracked, or bleeding.  Signs that your baby is getting enough milk  Wetting at least 1-2 diapers during the first 24 hours after birth.  Wetting at least 5-6 diapers every 24 hours for the first week after birth. The urine should be clear or pale yellow by the age of 5 days.  Wetting 6-8   diapers every 24 hours as your baby continues to grow and develop.  At least 3 stools in a 24-hour period by the age of 5 days. The stool should be soft and yellow.  At least 3 stools in a 24-hour period by the  age of 7 days. The stool should be seedy and yellow.  No loss of weight greater than 10% of birth weight during the first 3 days of life.  Average weight gain of 4-7 oz (113-198 g) per week after the age of 4 days.  Consistent daily weight gain by the age of 5 days, without weight loss after the age of 2 weeks. After a feeding, your baby may spit up a small amount of milk. This is normal. Breastfeeding frequency and duration Frequent feeding will help you make more milk and can prevent sore nipples and extremely full breasts (breast engorgement). Breastfeed when you feel the need to reduce the fullness of your breasts or when your baby shows signs of hunger. This is called "breastfeeding on demand." Signs that your baby is hungry include:  Increased alertness, activity, or restlessness.  Movement of the head from side to side.  Opening of the mouth when the corner of the mouth or cheek is stroked (rooting).  Increased sucking sounds, smacking lips, cooing, sighing, or squeaking.  Hand-to-mouth movements and sucking on fingers or hands.  Fussing or crying.  Avoid introducing a pacifier to your baby in the first 4-6 weeks after your baby is born. After this time, you may choose to use a pacifier. Research has shown that pacifier use during the first year of a baby's life decreases the risk of sudden infant death syndrome (SIDS). Allow your baby to feed on each breast as long as he or she wants. When your baby unlatches or falls asleep while feeding from the first breast, offer the second breast. Because newborns are often sleepy in the first few weeks of life, you may need to awaken your baby to get him or her to feed. Breastfeeding times will vary from baby to baby. However, the following rules can serve as a guide to help you make sure that your baby is properly fed:  Newborns (babies 4 weeks of age or younger) may breastfeed every 1-3 hours.  Newborns should not go without breastfeeding  for longer than 3 hours during the day or 5 hours during the night.  You should breastfeed your baby a minimum of 8 times in a 24-hour period.  Breast milk pumping Pumping and storing breast milk allows you to make sure that your baby is exclusively fed your breast milk, even at times when you are unable to breastfeed. This is especially important if you go back to work while you are still breastfeeding, or if you are not able to be present during feedings. Your lactation consultant can help you find a method of pumping that works best for you and give you guidelines about how long it is safe to store breast milk. Caring for your breasts while you breastfeed Nipples can become dry, cracked, and sore while breastfeeding. The following recommendations can help keep your breasts moisturized and healthy:  Avoid using soap on your nipples.  Wear a supportive bra designed especially for nursing. Avoid wearing underwire-style bras or extremely tight bras (sports bras).  Air-dry your nipples for 3-4 minutes after each feeding.  Use only cotton bra pads to absorb leaked breast milk. Leaking of breast milk between feedings is normal.  Use lanolin   on your nipples after breastfeeding. Lanolin helps to maintain your skin's normal moisture barrier. Pure lanolin is not harmful (not toxic) to your baby. You may also hand express a few drops of breast milk and gently massage that milk into your nipples and allow the milk to air-dry.  In the first few weeks after giving birth, some women experience breast engorgement. Engorgement can make your breasts feel heavy, warm, and tender to the touch. Engorgement peaks within 3-5 days after you give birth. The following recommendations can help to ease engorgement:  Completely empty your breasts while breastfeeding or pumping. You may want to start by applying warm, moist heat (in the shower or with warm, water-soaked hand towels) just before feeding or pumping. This  increases circulation and helps the milk flow. If your baby does not completely empty your breasts while breastfeeding, pump any extra milk after he or she is finished.  Apply ice packs to your breasts immediately after breastfeeding or pumping, unless this is too uncomfortable for you. To do this: ? Put ice in a plastic bag. ? Place a towel between your skin and the bag. ? Leave the ice on for 20 minutes, 2-3 times a day.  Make sure that your baby is latched on and positioned properly while breastfeeding.  If engorgement persists after 48 hours of following these recommendations, contact your health care provider or a lactation consultant. Overall health care recommendations while breastfeeding  Eat 3 healthy meals and 3 snacks every day. Well-nourished mothers who are breastfeeding need an additional 450-500 calories a day. You can meet this requirement by increasing the amount of a balanced diet that you eat.  Drink enough water to keep your urine pale yellow or clear.  Rest often, relax, and continue to take your prenatal vitamins to prevent fatigue, stress, and low vitamin and mineral levels in your body (nutrient deficiencies).  Do not use any products that contain nicotine or tobacco, such as cigarettes and e-cigarettes. Your baby may be harmed by chemicals from cigarettes that pass into breast milk and exposure to secondhand smoke. If you need help quitting, ask your health care provider.  Avoid alcohol.  Do not use illegal drugs or marijuana.  Talk with your health care provider before taking any medicines. These include over-the-counter and prescription medicines as well as vitamins and herbal supplements. Some medicines that may be harmful to your baby can pass through breast milk.  It is possible to become pregnant while breastfeeding. If birth control is desired, ask your health care provider about options that will be safe while breastfeeding your baby. Where to find more  information: [employer]: www.llli.org Contact a health care provider if:  You feel like you want to stop breastfeeding or have become frustrated with breastfeeding.  Your nipples are cracked or bleeding.  Your breasts are red, tender, or warm.  You have: ? Painful breasts or nipples. ? A swollen area on either breast. ? A fever or chills. ? Nausea or vomiting. ? Drainage other than breast milk from your nipples.  Your breasts do not become full before feedings by the fifth day after you give birth.  You feel sad and depressed.  Your baby is: ? Too sleepy to eat well. ? Having trouble sleeping. ? More than 1 week old and wetting fewer than 6 diapers in a 24-hour period. ? Not gaining weight by 5 days of age.  Your baby has fewer than 3 stools in a 24-hour period.    Your baby's skin or the white parts of his or her eyes become yellow. Get help right away if:  Your baby is overly tired (lethargic) and does not want to wake up and feed.  Your baby develops an unexplained fever. Summary  Breastfeeding offers many health benefits for infant and mothers.  Try to breastfeed your infant when he or she shows early signs of hunger.  Gently tickle or stroke your baby's lips with your finger or nipple to allow the baby to open his or her mouth. Bring the baby to your breast. Make sure that much of the areola is in your baby's mouth. Offer one side and burp the baby before you offer the other side.  Talk with your health care provider or lactation consultant if you have questions or you face problems as you breastfeed. This information is not intended to replace advice given to you by your health care provider. Make sure you discuss any questions you have with your health care provider. Document Released: 08/11/2005 Document Revised: 09/12/2016 Document Reviewed: 09/12/2016 Elsevier Interactive Patient Education  2018 Elsevier Inc.  

## 2017-08-31 NOTE — Progress Notes (Signed)
[facility] c[facility]. She denies other concerns at this time.

## 2017-09-01 ENCOUNTER — Other Ambulatory Visit: Payer: Self-pay | Admitting: Obstetrics and Gynecology

## 2017-09-01 LAB — VITAMIN D 25 HYDROXY (VIT D DEFICIENCY, FRACTURES): VIT D 25 HYDROXY: 17.1 ng/mL — AB (ref 30.0–100.0)

## 2017-09-01 MED ORDER — VITAMIN D (ERGOCALCIFEROL) 1.25 MG (50000 UNIT) PO CAPS: 50000.0000 [IU] | ORAL_CAPSULE | ORAL | 3 refills | Status: DC

## 2017-09-02 LAB — HEMOGLOBINOPATHY EVALUATION
HEMOGLOBIN A2 QUANTITATION: 2.4 % (ref 1.8–3.2)
HEMOGLOBIN F QUANTITATION: 0 % (ref 0.0–2.0)
HGB C: 0 %
HGB S: 0 %
HGB VARIANT: 0 %
Hgb A: 97.6 % (ref 96.4–98.8)

## 2017-09-02 LAB — OBSTETRIC PANEL, INCLUDING HIV
Antibody Screen: NEGATIVE
Basophils Absolute: 0 10*3/uL (ref 0.0–0.2)
Basos: 0 %
EOS (ABSOLUTE): 0.1 10*3/uL (ref 0.0–0.4)
EOS: 2 %
HEMATOCRIT: 37.6 % (ref 34.0–46.6)
HEMOGLOBIN: 12.6 g/dL (ref 11.1–15.9)
HIV Screen 4th Generation wRfx: NONREACTIVE
Hepatitis B Surface Ag: NEGATIVE
IMMATURE GRANS (ABS): 0 10*3/uL (ref 0.0–0.1)
IMMATURE GRANULOCYTES: 0 %
LYMPHS ABS: 1.6 10*3/uL (ref 0.7–3.1)
Lymphs: 25 %
MCH: 29.9 pg (ref 26.6–33.0)
MCHC: 33.5 g/dL (ref 31.5–35.7)
MCV: 89 fL (ref 79–97)
Monocytes Absolute: 0.4 10*3/uL (ref 0.1–0.9)
Monocytes: 6 %
NEUTROS PCT: 67 %
Neutrophils Absolute: 4.3 10*3/uL (ref 1.4–7.0)
Platelets: 217 10*3/uL (ref 150–379)
RBC: 4.22 x10E6/uL (ref 3.77–5.28)
RDW: 15.4 % (ref 12.3–15.4)
RH TYPE: POSITIVE
RPR: NONREACTIVE
Rubella Antibodies, IGG: 2.14 index (ref >0.99)
WBC: 6.4 10*3/uL (ref 3.4–10.8)

## 2017-09-02 LAB — COMPREHENSIVE METABOLIC PANEL
ALT: 11 IU/L (ref 0–32)
AST: 16 IU/L (ref 0–40)
Albumin/Globulin Ratio: 1.4 (ref 1.2–2.2)
Albumin: 4.4 g/dL (ref 3.5–5.5)
Alkaline Phosphatase: 47 IU/L (ref 39–117)
BUN/Creatinine Ratio: 8 — ABNORMAL LOW (ref 9–23)
BUN: 5 mg/dL — AB (ref 6–20)
Bilirubin Total: 0.2 mg/dL (ref 0.0–1.2)
CALCIUM: 9.1 mg/dL (ref 8.7–10.2)
CO2: 20 mmol/L (ref 20–29)
CREATININE: 0.61 mg/dL (ref 0.57–1.00)
Chloride: 99 mmol/L (ref 96–106)
GFR, EST AFRICAN AMERICAN: 147 mL/min/{1.73_m2} (ref >59)
GFR, EST NON AFRICAN AMERICAN: 127 mL/min/{1.73_m2} (ref >59)
GLUCOSE: 69 mg/dL (ref 65–99)
Globulin, Total: 3.2 g/dL (ref 1.5–4.5)
Potassium: 3.9 mmol/L (ref 3.5–5.2)
Sodium: 136 mmol/L (ref 134–144)
TOTAL PROTEIN: 7.6 g/dL (ref 6.0–8.5)

## 2017-09-02 LAB — PROTEIN / CREATININE RATIO, URINE
Creatinine, Urine: 138.7 mg/dL
PROTEIN/CREAT RATIO: 67 mg/g{creat} (ref 0–200)
Protein, Ur: 9.3 mg/dL

## 2017-09-03 ENCOUNTER — Other Ambulatory Visit: Payer: Self-pay | Admitting: Obstetrics and Gynecology

## 2017-09-03 DIAGNOSIS — Z3481 Encounter for supervision of other normal pregnancy, first trimester: Secondary | ICD-10-CM

## 2017-09-04 LAB — CULTURE, OB URINE

## 2017-09-04 LAB — URINE CULTURE, OB REFLEX

## 2017-09-07 LAB — SMN1 COPY NUMBER ANALYSIS (SMA CARRIER SCREENING)

## 2017-09-08 ENCOUNTER — Encounter (HOSPITAL_COMMUNITY): Payer: Self-pay | Admitting: Obstetrics and Gynecology

## 2017-09-08 LAB — CYSTIC FIBROSIS MUTATION 97: GENE DIS ANAL CARRIER INTERP BLD/T-IMP: NOT DETECTED

## 2017-09-10 MED FILL — VIT D2 1.25 MG (50,000 UNIT: 1.25 MG | 28 days supply | Qty: 4 | Fill #0

## 2017-09-15 ENCOUNTER — Ambulatory Visit (HOSPITAL_COMMUNITY)
Admission: RE | Admit: 2017-09-15 | Discharge: 2017-09-15 | Disposition: A | Discharge status: Home / Self Care | Payer: Medicaid Other | Source: Ambulatory Visit | Attending: Obstetrics and Gynecology | Admitting: Obstetrics and Gynecology

## 2017-09-15 ENCOUNTER — Other Ambulatory Visit: Payer: Self-pay | Admitting: Obstetrics and Gynecology

## 2017-09-15 ENCOUNTER — Encounter (HOSPITAL_COMMUNITY): Payer: Self-pay

## 2017-09-15 DIAGNOSIS — Z3481 Encounter for supervision of other normal pregnancy, first trimester: Secondary | ICD-10-CM | POA: Diagnosis present

## 2017-09-15 DIAGNOSIS — O09291 Supervision of pregnancy with other poor reproductive or obstetric history, first trimester: Secondary | ICD-10-CM | POA: Diagnosis not present

## 2017-09-15 DIAGNOSIS — Z3682 Encounter for antenatal screening for nuchal translucency: Secondary | ICD-10-CM | POA: Diagnosis not present

## 2017-09-15 DIAGNOSIS — Z3A13 13 weeks gestation of pregnancy: Secondary | ICD-10-CM | POA: Insufficient documentation

## 2017-09-15 NOTE — Addendum Note (Signed)
Encounter addended by: Drue NovelKeatts, Emory Leaver J, RDMS on: 09/15/2017 9:42 AM  Actions taken: Imaging Exam ended

## 2017-09-21 ENCOUNTER — Encounter: Payer: Self-pay | Admitting: Obstetrics and Gynecology

## 2017-09-25 ENCOUNTER — Other Ambulatory Visit (HOSPITAL_COMMUNITY): Payer: Self-pay

## 2017-09-28 ENCOUNTER — Ambulatory Visit (INDEPENDENT_AMBULATORY_CARE_PROVIDER_SITE_OTHER): Payer: BLUE CROSS/BLUE SHIELD | Admitting: Obstetrics and Gynecology

## 2017-09-28 ENCOUNTER — Encounter: Payer: Self-pay | Admitting: Obstetrics and Gynecology

## 2017-09-28 ENCOUNTER — Encounter: Payer: Self-pay | Admitting: *Deleted

## 2017-09-28 VITALS — BP 123/77 | HR 67 | Wt 171.9 lb

## 2017-09-28 DIAGNOSIS — Z3481 Encounter for supervision of other normal pregnancy, first trimester: Secondary | ICD-10-CM

## 2017-09-28 DIAGNOSIS — O09299 Supervision of pregnancy with other poor reproductive or obstetric history, unspecified trimester: Secondary | ICD-10-CM

## 2017-09-28 DIAGNOSIS — O09292 Supervision of pregnancy with other poor reproductive or obstetric history, second trimester: Secondary | ICD-10-CM
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
## 2017-09-29 ENCOUNTER — Encounter: Payer: Self-pay | Admitting: *Deleted

## 2017-10-06 ENCOUNTER — Encounter (HOSPITAL_COMMUNITY): Payer: Self-pay | Admitting: *Deleted

## 2017-10-06 ENCOUNTER — Inpatient Hospital Stay (HOSPITAL_COMMUNITY)
Admission: AD | Admit: 2017-10-06 | Discharge: 2017-10-06 | Disposition: A | Discharge status: Home / Self Care | Payer: BLUE CROSS/BLUE SHIELD | Source: Ambulatory Visit | Attending: Obstetrics and Gynecology | Admitting: Obstetrics and Gynecology

## 2017-10-06 DIAGNOSIS — Z3A16 16 weeks gestation of pregnancy: Secondary | ICD-10-CM | POA: Insufficient documentation

## 2017-10-06 DIAGNOSIS — O09299 Supervision of pregnancy with other poor reproductive or obstetric history, unspecified trimester: Secondary | ICD-10-CM

## 2017-10-06 DIAGNOSIS — Z87891 Personal history of nicotine dependence: Secondary | ICD-10-CM | POA: Insufficient documentation

## 2017-10-06 DIAGNOSIS — B9689 Other specified bacterial agents as the cause of diseases classified elsewhere: Secondary | ICD-10-CM

## 2017-10-06 DIAGNOSIS — O99512 Diseases of the respiratory system complicating pregnancy, second trimester: Secondary | ICD-10-CM | POA: Diagnosis not present

## 2017-10-06 DIAGNOSIS — R109 Unspecified abdominal pain: Secondary | ICD-10-CM | POA: Diagnosis present

## 2017-10-06 DIAGNOSIS — Z7982 Long term (current) use of aspirin: Secondary | ICD-10-CM | POA: Diagnosis not present

## 2017-10-06 DIAGNOSIS — O23592 Infection of other part of genital tract in pregnancy, second trimester: Secondary | ICD-10-CM | POA: Insufficient documentation

## 2017-10-06 DIAGNOSIS — N898 Other specified noninflammatory disorders of vagina: Secondary | ICD-10-CM

## 2017-10-06 DIAGNOSIS — R102 Pelvic and perineal pain: Secondary | ICD-10-CM | POA: Diagnosis present

## 2017-10-06 DIAGNOSIS — O162 Unspecified maternal hypertension, second trimester: Secondary | ICD-10-CM | POA: Diagnosis not present

## 2017-10-06 DIAGNOSIS — Z79899 Other long term (current) drug therapy: Secondary | ICD-10-CM | POA: Diagnosis not present

## 2017-10-06 DIAGNOSIS — O09892 Supervision of other high risk pregnancies, second trimester: Secondary | ICD-10-CM | POA: Insufficient documentation

## 2017-10-06 DIAGNOSIS — N760 Acute vaginitis: Secondary | ICD-10-CM | POA: Diagnosis not present

## 2017-10-06 DIAGNOSIS — O26892 Other specified pregnancy related conditions, second trimester: Secondary | ICD-10-CM

## 2017-10-06 DIAGNOSIS — J45909 Unspecified asthma, uncomplicated: Secondary | ICD-10-CM | POA: Diagnosis not present

## 2017-10-06 DIAGNOSIS — O26899 Other specified pregnancy related conditions, unspecified trimester: Secondary | ICD-10-CM | POA: Diagnosis not present

## 2017-10-06 LAB — URINALYSIS, ROUTINE W REFLEX MICROSCOPIC
Bilirubin Urine: NEGATIVE
Glucose, UA: NEGATIVE mg/dL
Hgb urine dipstick: NEGATIVE
Ketones, ur: NEGATIVE mg/dL
Leukocytes, UA: NEGATIVE
Nitrite: NEGATIVE
Protein, ur: NEGATIVE mg/dL
Specific Gravity, Urine: 1.016 (ref 1.005–1.030)
pH: 8 (ref 5.0–8.0)

## 2017-10-06 LAB — WET PREP, GENITAL
Sperm: NONE SEEN
Trich, Wet Prep: NONE SEEN
Yeast Wet Prep HPF POC: NONE SEEN

## 2017-10-06 LAB — OB RESULTS CONSOLE GC/CHLAMYDIA: GC PROBE AMP, GENITAL: NEGATIVE

## 2017-10-06 MED ORDER — METRONIDAZOLE 500 MG PO TABS: 500.0000 mg | ORAL_TABLET | Freq: Two times a day (BID) | ORAL | 0 refills | Status: DC

## 2017-10-06 MED ORDER — COMFORT FIT MATERNITY SUPP MED MISC: 1.0000 | Freq: Every day | 0 refills | Status: DC

## 2017-10-06 MED ORDER — ACETAMINOPHEN 500 MG PO TABS
1000.0000 mg | ORAL_TABLET | Freq: Once | ORAL | Status: AC
  Administered 2017-10-06: 1000 mg via ORAL
  Filled 2017-10-06: qty 2

## 2017-10-06 MED FILL — metroNIDAZOLE 500 MG TABS: 500 | 7 days supply | Qty: 14 | Fill #0

## 2017-10-06 NOTE — MAU Provider Note (Signed)
Chief Complaint: Abdominal Pain and Vaginal Pain   First Provider Initiated Contact with Patient 10/06/17 [phone]     SUBJECTIVE HPI: Sydney Henry is a 27 y.o. [id number] at [redacted]w[redacted]d by LMP who presents to maternity admissions reporting abdominal pain and vaginal pain. She reports the abdominal pain as sharp stabbing pain that gets worse when she moves, she reports feeling "a pulling sensation". Both pain and pulling sensation began on Sunday. She rates abdominal pain 8/10- has not taken medication for pain. She reports feeling pressure in her vagina "like baby is sitting right on top of her hips". She reports vaginal discharge started on Sunday as well but occurred prior to the abdominal pain starting. She denies vaginal bleeding, vaginal itching/burning, urinary symptoms, h/a, dizziness, n/v, or fever/chills.    Past Medical History:  Diagnosis Date  . Asthma    rarely uses inhaler - seasonal  . Complication of anesthesia    Allergy to component of epidural during labor  . Hypertension    Hx 2013 pregnancy only   Past Surgical History:  Procedure Laterality Date  . DILATION AND CURETTAGE OF UTERUS    . DILATION AND EVACUATION N/A 01/28/2017   Procedure: DILATATION AND EVACUATION;  Surgeon: Federico Flake, MD;  Location: [facility];  Service: Gynecology;  Laterality: N/A;  . INDUCED ABORTION    . IUD REMOVAL N/A 01/31/2015   Procedure: INTRAUTERINE DEVICE (IUD) REMOVAL;  Surgeon: Allie Bossier, MD;  Location: [facility];  Service: Gynecology;  Laterality: N/A;  . WISDOM TOOTH EXTRACTION     Social History   Socioeconomic History  . Marital status: Single    Spouse name: Not on file  . Number of children: Not on file  . Years of education: Not on file  . Highest education level: Not on file  Social Needs  . Financial resource strain: Not on file  . Food insecurity - worry: Not on file  . Food insecurity - inability: Not on file  . Transportation needs - medical: Not on file  .  Transportation needs - non-medical: Not on file  Occupational History  . Not on file  Tobacco Use  . Smoking status: Former Smoker    Packs/day: 0.25    Years: 5.00    Pack years: 1.25    Types: Cigarettes    Last attempt to quit: 12/23/2016    Years since quitting: 0.7  . Smokeless tobacco: Never Used  Substance and Sexual Activity  . Alcohol use: No    Frequency: Never    Comment: social but none since 11/2016  . Drug use: No    Comment: last used marijuana 01/28/17  . Sexual activity: Yes    Birth control/protection: None    Comment: approx [redacted] wks gestation per patient   Other Topics Concern  . Not on file  Social History Narrative  . Not on file   No current facility-administered medications on file prior to encounter.    Current Outpatient Medications on File Prior to Encounter  Medication Sig Dispense Refill  . albuterol (PROVENTIL HFA;VENTOLIN HFA) 108 (90 Base) MCG/ACT inhaler Inhale 2 puffs into the lungs every 6 (six) hours as needed for wheezing or shortness of breath.    Marland Kitchen aspirin EC 81 MG tablet Take 1 tablet (81 mg total) by mouth daily. Take after 12 weeks for prevention of preeclampsia later in pregnancy 300 tablet 2  . Prenatal Vit-Fe Fumarate-FA (MULTIVITAMIN-PRENATAL) 27-0.8 MG TABS tablet Take 1 tablet by mouth daily  at 12 noon. 30 each 10  . promethazine (PHENERGAN) 25 MG tablet Take 1 tablet (25 mg total) by mouth every 6 (six) hours as needed for nausea or vomiting. 30 tablet 2  . Vitamin D, Ergocalciferol, (DRISDOL) 50000 units CAPS capsule Take 1 capsule (50,000 Units total) by mouth every 7 (seven) days. 4 capsule 3   Allergies  Allergen Reactions  . Other Hives, Itching and Other (See Comments)    After getting epidural pt had a reaction, she is not sure which component caused the reactions     ROS:  Review of Systems  Constitutional: Negative.   Respiratory: Negative.   Cardiovascular: Negative.   Gastrointestinal: Positive for abdominal pain.  Negative for constipation, diarrhea, nausea and vomiting.  Genitourinary: Positive for vaginal discharge and vaginal pain. Negative for difficulty urinating, frequency and urgency.  Musculoskeletal: Negative.   Neurological: Negative.   Psychiatric/Behavioral: Negative.    I have reviewed patient's Past Medical Hx, Surgical Hx, Family Hx, Social Hx, medications and allergies.   Physical Exam   Patient Vitals for the past 24 hrs:  BP Temp Temp src Pulse Resp SpO2 Height Weight  10/06/17 1022 124/64 - - 70 18 - - -  10/06/17 0840 130/73 99 F (37.2 C) Oral 80 17 100 % 5\' 8"  (1.727 m) 178 lb (80.7 kg)   Constitutional: Well-developed, well-nourished female in no acute distress.  Cardiovascular: normal rate Respiratory: normal effort GI: Abd soft, non-tender. Pos BS x 4 MS: Extremities nontender, no edema, normal ROM Neurologic: Alert and oriented x 4.  GU: Neg CVAT.  PELVIC EXAM: Cervix pink, visually closed, without lesion, moderate white creamy discharge with mild odor, vaginal walls and external genitalia normal Bimanual exam: Cervix 0/long/high, firm, anterior, neg CMT, uterus nontender, nonenlarged, adnexa without tenderness, enlargement, or mass  FHT 152 by doppler  LAB RESULTS Results for orders placed or performed during the hospital encounter of 10/06/17 (from the past 24 hour(s))  Urinalysis, Routine w reflex microscopic     Status: None   Collection Time: 10/06/17  8:44 AM  Result Value Ref Range   Color, Urine YELLOW YELLOW   APPearance CLEAR CLEAR   Specific Gravity, Urine 1.016 1.005 - 1.030   pH 8.0 5.0 - 8.0   Glucose, UA NEGATIVE NEGATIVE mg/dL   Hgb urine dipstick NEGATIVE NEGATIVE   Bilirubin Urine NEGATIVE NEGATIVE   Ketones, ur NEGATIVE NEGATIVE mg/dL   Protein, ur NEGATIVE NEGATIVE mg/dL   Nitrite NEGATIVE NEGATIVE   Leukocytes, UA NEGATIVE NEGATIVE  Wet prep, genital     Status: Abnormal   Collection Time: 10/06/17  9:32 AM  Result Value Ref Range    Yeast Wet Prep HPF POC NONE SEEN NONE SEEN   Trich, Wet Prep NONE SEEN NONE SEEN   Clue Cells Wet Prep HPF POC PRESENT (A) NONE SEEN   WBC, Wet Prep HPF POC MODERATE (A) NONE SEEN   Sperm NONE SEEN     B/Positive/-- (01/07 1530)  MAU Management/MDM: Orders Placed This Encounter  Procedures  . Wet prep, genital  . Urinalysis, Routine w reflex microscopic  Wet prep- showed clue cells, will treat for BV based on s/s, odor and presence of clue cells  UA- neg  Meds ordered this encounter  Medications  . acetaminophen (TYLENOL) tablet 1,000 mg  . metroNIDAZOLE (FLAGYL) 500 MG tablet    Sig: Take 1 tablet (500 mg total) by mouth 2 (two) times daily.    Dispense:  14 tablet  Refill:  0    Order Specific Question:   Supervising Provider    Answer:   CONSTANT, PEGGY [[id number]]  . Elastic Bandages & Supports (COMFORT FIT MATERNITY SUPP MED) MISC    Sig: 1 Device by Does not apply route daily.    Dispense:  1 each    Refill:  0    Order Specific Question:   Supervising Provider    Answer:   CONSTANT, PEGGY [[id number]]   Treatments in MAU included 1,000mg  Tylenol for abdominal pain- pain relieved with medication. Pt discharged with instructions to follow up as scheduled for prenatal appointments   ASSESSMENT 1. Pain of round ligament during pregnancy   2. Hx of preeclampsia, prior pregnancy, currently pregnant   3. Abdominal pain during pregnancy in second trimester   4. BV (bacterial vaginosis)   5. Vaginal discharge during pregnancy in second trimester   6. Vaginal odor     PLAN Discharge home Rx for Flagyl  Rx for Maternity support belt  Follow up as scheduled for prenatal visits  Return to MAU as needed for worsening symptoms   Allergies as of 10/06/2017      Reactions   Other Hives, Itching, Other (See Comments)   After getting epidural pt had a reaction, she is not sure which component caused the reactions       Medication List    STOP taking these medications    promethazine 25 MG tablet Commonly known as:  PHENERGAN     TAKE these medications   albuterol 108 (90 Base) MCG/ACT inhaler Commonly known as:  PROVENTIL HFA;VENTOLIN HFA Inhale 2 puffs into the lungs every 6 (six) hours as needed for wheezing or shortness of breath.   aspirin EC 81 MG tablet Take 1 tablet (81 mg total) by mouth daily. Take after 12 weeks for prevention of preeclampsia later in pregnancy   COMFORT FIT MATERNITY SUPP MED Misc 1 Device by Does not apply route daily.   metroNIDAZOLE 500 MG tablet Commonly known as:  FLAGYL Take 1 tablet (500 mg total) by mouth 2 (two) times daily.   multivitamin-prenatal 27-0.8 MG Tabs tablet Take 1 tablet by mouth daily at 12 noon.       Steward DroneVeronica Devarious Pavek  Certified Nurse-Midwife 10/06/2017  11:37 AM

## 2017-10-06 NOTE — MAU Note (Signed)
Pt reports worsening pain and pressure in vaginal area, worsening lower abd pain. Very painful to have a BM.

## 2017-10-07 LAB — GC/CHLAMYDIA PROBE AMP (~~LOC~~) NOT AT ARMC
Chlamydia: NEGATIVE
Neisseria Gonorrhea: NEGATIVE

## 2017-10-21 ENCOUNTER — Ambulatory Visit (HOSPITAL_COMMUNITY)
Admission: RE | Admit: 2017-10-21 | Discharge: 2017-10-21 | Disposition: A | Discharge status: Home / Self Care | Payer: BLUE CROSS/BLUE SHIELD | Source: Ambulatory Visit | Attending: Obstetrics and Gynecology | Admitting: Obstetrics and Gynecology

## 2017-10-21 ENCOUNTER — Other Ambulatory Visit: Payer: Self-pay | Admitting: Obstetrics and Gynecology

## 2017-10-21 ENCOUNTER — Encounter (HOSPITAL_COMMUNITY): Payer: Self-pay

## 2017-10-21 DIAGNOSIS — Z3689 Encounter for other specified antenatal screening: Secondary | ICD-10-CM | POA: Diagnosis present

## 2017-10-21 DIAGNOSIS — O09299 Supervision of pregnancy with other poor reproductive or obstetric history, unspecified trimester: Secondary | ICD-10-CM

## 2017-10-21 DIAGNOSIS — O09292 Supervision of pregnancy with other poor reproductive or obstetric history, second trimester: Secondary | ICD-10-CM | POA: Diagnosis present

## 2017-10-21 DIAGNOSIS — Z3A19 19 weeks gestation of pregnancy: Secondary | ICD-10-CM | POA: Diagnosis present

## 2017-10-21 DIAGNOSIS — Z3481 Encounter for supervision of other normal pregnancy, first trimester: Secondary | ICD-10-CM

## 2017-10-26 ENCOUNTER — Encounter: Payer: Self-pay | Admitting: Obstetrics and Gynecology

## 2017-10-26 ENCOUNTER — Ambulatory Visit (INDEPENDENT_AMBULATORY_CARE_PROVIDER_SITE_OTHER): Payer: BLUE CROSS/BLUE SHIELD | Admitting: Obstetrics and Gynecology

## 2017-10-26 VITALS — BP 135/76 | HR 69 | Wt 182.6 lb

## 2017-10-26 DIAGNOSIS — O09299 Supervision of pregnancy with other poor reproductive or obstetric history, unspecified trimester: Secondary | ICD-10-CM

## 2017-10-26 DIAGNOSIS — Z3481 Encounter for supervision of other normal pregnancy, first trimester: Secondary | ICD-10-CM

## 2017-10-26 DIAGNOSIS — I10 Essential (primary) hypertension: Secondary | ICD-10-CM
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
## 2017-10-28 LAB — AFP, SERUM, OPEN SPINA BIFIDA
AFP MOM: 1.33
AFP Value: 66.7 ng/mL
Gest. Age on Collection Date: 19.5 weeks
Maternal Age At EDD: 25.4 yr
OSBR RISK 1 IN: 8798
TEST RESULTS AFP: NEGATIVE
Weight: 182 [lb_av]

## 2017-11-23 ENCOUNTER — Encounter: Payer: BLUE CROSS/BLUE SHIELD | Admitting: Obstetrics and Gynecology

## 2017-11-23 ENCOUNTER — Ambulatory Visit (INDEPENDENT_AMBULATORY_CARE_PROVIDER_SITE_OTHER): Payer: BLUE CROSS/BLUE SHIELD | Admitting: Obstetrics and Gynecology

## 2017-11-23 ENCOUNTER — Encounter: Payer: Self-pay | Admitting: Obstetrics and Gynecology

## 2017-11-23 VITALS — BP 117/74 | HR 67 | Wt 196.0 lb

## 2017-11-23 DIAGNOSIS — O09292 Supervision of pregnancy with other poor reproductive or obstetric history, second trimester: Secondary | ICD-10-CM

## 2017-11-23 DIAGNOSIS — O09299 Supervision of pregnancy with other poor reproductive or obstetric history, unspecified trimester: Secondary | ICD-10-CM

## 2017-11-23 DIAGNOSIS — I10 Essential (primary) hypertension: Secondary | ICD-10-CM

## 2017-11-23 DIAGNOSIS — Z3481 Encounter for supervision of other normal pregnancy, first trimester: Secondary | ICD-10-CM

## 2017-11-23 DIAGNOSIS — Z3482 Encounter for supervision of other normal pregnancy, second trimester: Secondary | ICD-10-CM

## 2017-11-23 MED ORDER — COMFORT FIT MATERNITY SUPP MED MISC: 0 refills | Status: DC
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
## 2017-11-26 ENCOUNTER — Encounter: Payer: Self-pay | Admitting: Obstetrics

## 2017-11-26 ENCOUNTER — Encounter: Payer: Self-pay | Admitting: *Deleted

## 2017-12-21 ENCOUNTER — Encounter: Payer: Self-pay | Admitting: Obstetrics and Gynecology

## 2017-12-21 ENCOUNTER — Other Ambulatory Visit: Payer: BLUE CROSS/BLUE SHIELD

## 2017-12-21 ENCOUNTER — Ambulatory Visit (INDEPENDENT_AMBULATORY_CARE_PROVIDER_SITE_OTHER): Payer: BLUE CROSS/BLUE SHIELD | Admitting: Obstetrics and Gynecology

## 2017-12-21 VITALS — BP 128/78 | HR 76 | Wt 203.7 lb

## 2017-12-21 DIAGNOSIS — Z23 Encounter for immunization: Secondary | ICD-10-CM

## 2017-12-21 DIAGNOSIS — O09299 Supervision of pregnancy with other poor reproductive or obstetric history, unspecified trimester: Secondary | ICD-10-CM

## 2017-12-21 DIAGNOSIS — I10 Essential (primary) hypertension: Secondary | ICD-10-CM

## 2017-12-21 DIAGNOSIS — Z3481 Encounter for supervision of other normal pregnancy, first trimester: Secondary | ICD-10-CM

## 2017-12-21 DIAGNOSIS — O09292 Supervision of pregnancy with other poor reproductive or obstetric history, second trimester: Secondary | ICD-10-CM
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
## 2017-12-25 LAB — CBC
HEMATOCRIT: 32.3 % — AB (ref 34.0–46.6)
Hemoglobin: 10.5 g/dL — ABNORMAL LOW (ref 11.1–15.9)
MCH: 29.7 pg (ref 26.6–33.0)
MCHC: 32.5 g/dL (ref 31.5–35.7)
MCV: 91 fL (ref 79–97)
PLATELETS: 197 10*3/uL (ref 150–379)
RBC: 3.54 x10E6/uL — AB (ref 3.77–5.28)
RDW: 13.3 % (ref 12.3–15.4)
WBC: 7.4 10*3/uL (ref 3.4–10.8)

## 2017-12-25 LAB — GLUCOSE TOLERANCE, 2 HOURS W/ 1HR
GLUCOSE, 2 HOUR: 81 mg/dL (ref 65–152)
GLUCOSE, FASTING: 64 mg/dL — AB (ref 65–91)
Glucose, 1 hour: 53 mg/dL — ABNORMAL LOW (ref 65–179)

## 2017-12-25 LAB — HIV ANTIBODY (ROUTINE TESTING W REFLEX): HIV Screen 4th Generation wRfx: NONREACTIVE

## 2017-12-25 LAB — RPR: RPR: NONREACTIVE

## 2017-12-30 ENCOUNTER — Telehealth: Payer: Self-pay

## 2017-12-30 NOTE — Telephone Encounter (Signed)
Returned call and pt wanted results.

## 2018-01-04 ENCOUNTER — Encounter: Payer: Self-pay | Admitting: Obstetrics and Gynecology

## 2018-01-04 ENCOUNTER — Encounter: Payer: BLUE CROSS/BLUE SHIELD | Admitting: Obstetrics and Gynecology

## 2018-01-04 ENCOUNTER — Other Ambulatory Visit (HOSPITAL_COMMUNITY)
Admission: RE | Admit: 2018-01-04 | Discharge: 2018-01-04 | Disposition: A | Discharge status: Home / Self Care | Payer: BLUE CROSS/BLUE SHIELD | Source: Ambulatory Visit | Attending: Obstetrics and Gynecology | Admitting: Obstetrics and Gynecology

## 2018-01-04 ENCOUNTER — Ambulatory Visit (INDEPENDENT_AMBULATORY_CARE_PROVIDER_SITE_OTHER): Payer: BLUE CROSS/BLUE SHIELD | Admitting: Obstetrics and Gynecology

## 2018-01-04 VITALS — BP 128/78 | HR 79 | Wt 203.7 lb

## 2018-01-04 DIAGNOSIS — O09299 Supervision of pregnancy with other poor reproductive or obstetric history, unspecified trimester: Secondary | ICD-10-CM

## 2018-01-04 DIAGNOSIS — Z3481 Encounter for supervision of other normal pregnancy, first trimester: Secondary | ICD-10-CM | POA: Insufficient documentation

## 2018-01-04 DIAGNOSIS — I10 Essential (primary) hypertension: Secondary | ICD-10-CM
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
## 2018-01-04 NOTE — Progress Notes (Signed)
Patient reports good fetal movement with irregular contractions. Pt complains of vaginal itching and irritation.

## 2018-01-05 LAB — CERVICOVAGINAL ANCILLARY ONLY
BACTERIAL VAGINITIS: NEGATIVE
CANDIDA VAGINITIS: NEGATIVE

## 2018-01-19 ENCOUNTER — Ambulatory Visit (INDEPENDENT_AMBULATORY_CARE_PROVIDER_SITE_OTHER): Payer: BLUE CROSS/BLUE SHIELD | Admitting: Certified Nurse Midwife

## 2018-01-19 ENCOUNTER — Encounter: Payer: Self-pay | Admitting: Certified Nurse Midwife

## 2018-01-19 VITALS — BP 128/78 | HR 77 | Wt 208.1 lb

## 2018-01-19 DIAGNOSIS — O09299 Supervision of pregnancy with other poor reproductive or obstetric history, unspecified trimester: Secondary | ICD-10-CM

## 2018-01-19 DIAGNOSIS — Z3480 Encounter for supervision of other normal pregnancy, unspecified trimester: Secondary | ICD-10-CM

## 2018-01-19 DIAGNOSIS — O09293 Supervision of pregnancy with other poor reproductive or obstetric history, third trimester: Secondary | ICD-10-CM

## 2018-01-19 DIAGNOSIS — R102 Pelvic and perineal pain: Secondary | ICD-10-CM

## 2018-01-19 DIAGNOSIS — R300 Dysuria: Secondary | ICD-10-CM

## 2018-01-19 DIAGNOSIS — O26893 Other specified pregnancy related conditions, third trimester: Secondary | ICD-10-CM

## 2018-01-19 DIAGNOSIS — O26899 Other specified pregnancy related conditions, unspecified trimester: Secondary | ICD-10-CM

## 2018-01-19 LAB — POCT URINALYSIS DIPSTICK
Bilirubin, UA: NEGATIVE
Blood, UA: NEGATIVE
Glucose, UA: NEGATIVE
KETONES UA: NEGATIVE
NITRITE UA: NEGATIVE
PROTEIN UA: POSITIVE — AB
SPEC GRAV UA: 1.01 (ref 1.010–1.025)
Urobilinogen, UA: 0.2 E.U./dL
pH, UA: 7 (ref 5.0–8.0)

## 2018-01-19 MED ORDER — CYCLOBENZAPRINE HCL 10 MG PO TABS: 10.0000 mg | ORAL_TABLET | Freq: Three times a day (TID) | ORAL | 1 refills | Status: DC | PRN
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
## 2018-02-02 ENCOUNTER — Inpatient Hospital Stay (HOSPITAL_COMMUNITY)
Admission: RE | Admit: 2018-02-02 | Discharge: 2018-02-02 | Disposition: A | Discharge status: Home / Self Care | Payer: BLUE CROSS/BLUE SHIELD | Source: Ambulatory Visit | Attending: Certified Nurse Midwife | Admitting: Certified Nurse Midwife

## 2018-02-02 ENCOUNTER — Encounter (HOSPITAL_COMMUNITY): Payer: Self-pay

## 2018-02-02 ENCOUNTER — Other Ambulatory Visit: Payer: Self-pay | Admitting: Certified Nurse Midwife

## 2018-02-02 ENCOUNTER — Ambulatory Visit (INDEPENDENT_AMBULATORY_CARE_PROVIDER_SITE_OTHER): Payer: BLUE CROSS/BLUE SHIELD | Admitting: Certified Nurse Midwife

## 2018-02-02 VITALS — BP 130/79 | HR 99 | Wt 215.6 lb

## 2018-02-02 DIAGNOSIS — O09293 Supervision of pregnancy with other poor reproductive or obstetric history, third trimester: Secondary | ICD-10-CM

## 2018-02-02 DIAGNOSIS — Z3480 Encounter for supervision of other normal pregnancy, unspecified trimester: Secondary | ICD-10-CM

## 2018-02-02 DIAGNOSIS — O368390 Maternal care for abnormalities of the fetal heart rate or rhythm, unspecified trimester, not applicable or unspecified: Secondary | ICD-10-CM

## 2018-02-02 DIAGNOSIS — O368130 Decreased fetal movements, third trimester, not applicable or unspecified: Secondary | ICD-10-CM | POA: Diagnosis present

## 2018-02-02 DIAGNOSIS — O368330 Maternal care for abnormalities of the fetal heart rate or rhythm, third trimester, not applicable or unspecified: Secondary | ICD-10-CM

## 2018-02-02 DIAGNOSIS — O09893 Supervision of other high risk pregnancies, third trimester: Secondary | ICD-10-CM

## 2018-02-02 DIAGNOSIS — O288 Other abnormal findings on antenatal screening of mother: Secondary | ICD-10-CM

## 2018-02-02 DIAGNOSIS — Z3A33 33 weeks gestation of pregnancy: Secondary | ICD-10-CM | POA: Insufficient documentation

## 2018-02-02 DIAGNOSIS — O09299 Supervision of pregnancy with other poor reproductive or obstetric history, unspecified trimester: Secondary | ICD-10-CM

## 2018-02-02 NOTE — Progress Notes (Signed)
Patient reports decreased fetal movement in the last 2 weeks, and reports a lot of pelvic pain and complains of having to constantly take flexeril for relief.
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
## 2018-02-03 ENCOUNTER — Other Ambulatory Visit: Payer: Self-pay | Admitting: Certified Nurse Midwife

## 2018-02-03 ENCOUNTER — Encounter: Payer: Self-pay | Admitting: Certified Nurse Midwife

## 2018-02-03 DIAGNOSIS — O99013 Anemia complicating pregnancy, third trimester: Secondary | ICD-10-CM | POA: Insufficient documentation

## 2018-02-03 DIAGNOSIS — O99019 Anemia complicating pregnancy, unspecified trimester: Secondary | ICD-10-CM

## 2018-02-03 HISTORY — DX: Anemia complicating pregnancy, unspecified trimester: O99.019

## 2018-02-03 LAB — CBC
HEMATOCRIT: 28.8 % — AB (ref 34.0–46.6)
Hemoglobin: 9.6 g/dL — ABNORMAL LOW (ref 11.1–15.9)
MCH: 27.8 pg (ref 26.6–33.0)
MCHC: 33.3 g/dL (ref 31.5–35.7)
MCV: 84 fL (ref 79–97)
PLATELETS: 214 10*3/uL (ref 150–450)
RBC: 3.45 x10E6/uL — ABNORMAL LOW (ref 3.77–5.28)
RDW: 14.1 % (ref 12.3–15.4)
WBC: 7.3 10*3/uL (ref 3.4–10.8)

## 2018-02-03 LAB — COMPREHENSIVE METABOLIC PANEL
A/G RATIO: 1.1 — AB (ref 1.2–2.2)
ALBUMIN: 3.3 g/dL — AB (ref 3.5–5.5)
ALK PHOS: 91 IU/L (ref 39–117)
ALT: 16 IU/L (ref 0–32)
AST: 19 IU/L (ref 0–40)
BUN / CREAT RATIO: 8 — AB (ref 9–23)
BUN: 4 mg/dL — ABNORMAL LOW (ref 6–20)
CO2: 19 mmol/L — ABNORMAL LOW (ref 20–29)
Calcium: 8.6 mg/dL — ABNORMAL LOW (ref 8.7–10.2)
Chloride: 105 mmol/L (ref 96–106)
Creatinine, Ser: 0.49 mg/dL — ABNORMAL LOW (ref 0.57–1.00)
GFR calc non Af Amer: 136 mL/min/{1.73_m2} (ref >59)
GFR, EST AFRICAN AMERICAN: 157 mL/min/{1.73_m2} (ref >59)
GLUCOSE: 68 mg/dL (ref 65–99)
Globulin, Total: 2.9 g/dL (ref 1.5–4.5)
POTASSIUM: 3.7 mmol/L (ref 3.5–5.2)
Sodium: 138 mmol/L (ref 134–144)
TOTAL PROTEIN: 6.2 g/dL (ref 6.0–8.5)

## 2018-02-03 LAB — PROTEIN / CREATININE RATIO, URINE
Creatinine, Urine: 140.5 mg/dL
PROTEIN UR: 20.3 mg/dL
PROTEIN/CREAT RATIO: 144 mg/g{creat} (ref 0–200)

## 2018-02-04 ENCOUNTER — Other Ambulatory Visit: Payer: Self-pay | Admitting: Certified Nurse Midwife

## 2018-02-04 DIAGNOSIS — Z3480 Encounter for supervision of other normal pregnancy, unspecified trimester: Secondary | ICD-10-CM

## 2018-02-04 DIAGNOSIS — O99013 Anemia complicating pregnancy, third trimester: Secondary | ICD-10-CM

## 2018-02-04 MED ORDER — CITRANATAL BLOOM 90-1 MG PO TABS: 1.0000 | ORAL_TABLET | Freq: Every day | ORAL | 12 refills | Status: DC

## 2018-02-09 ENCOUNTER — Observation Stay (HOSPITAL_COMMUNITY)
Admission: AD | Admit: 2018-02-09 | Discharge: 2018-02-10 | Disposition: A | Discharge status: Home / Self Care | Payer: BLUE CROSS/BLUE SHIELD | Source: Ambulatory Visit | Attending: Family Medicine | Admitting: Family Medicine

## 2018-02-09 ENCOUNTER — Encounter (HOSPITAL_COMMUNITY): Payer: Self-pay | Admitting: *Deleted

## 2018-02-09 ENCOUNTER — Ambulatory Visit (INDEPENDENT_AMBULATORY_CARE_PROVIDER_SITE_OTHER): Payer: BLUE CROSS/BLUE SHIELD | Admitting: Certified Nurse Midwife

## 2018-02-09 ENCOUNTER — Other Ambulatory Visit: Payer: Self-pay

## 2018-02-09 ENCOUNTER — Other Ambulatory Visit (HOSPITAL_COMMUNITY)
Admission: RE | Admit: 2018-02-09 | Discharge: 2018-02-09 | Disposition: A | Discharge status: Home / Self Care | Payer: BLUE CROSS/BLUE SHIELD | Source: Ambulatory Visit | Attending: Certified Nurse Midwife | Admitting: Certified Nurse Midwife

## 2018-02-09 ENCOUNTER — Other Ambulatory Visit: Payer: Self-pay | Admitting: Certified Nurse Midwife

## 2018-02-09 VITALS — BP 141/86 | HR 84 | Wt 217.0 lb

## 2018-02-09 DIAGNOSIS — M549 Dorsalgia, unspecified: Secondary | ICD-10-CM | POA: Insufficient documentation

## 2018-02-09 DIAGNOSIS — O4703 False labor before 37 completed weeks of gestation, third trimester: Secondary | ICD-10-CM | POA: Diagnosis present

## 2018-02-09 DIAGNOSIS — Z87891 Personal history of nicotine dependence: Secondary | ICD-10-CM | POA: Diagnosis not present

## 2018-02-09 DIAGNOSIS — O09293 Supervision of pregnancy with other poor reproductive or obstetric history, third trimester: Secondary | ICD-10-CM

## 2018-02-09 DIAGNOSIS — J45909 Unspecified asthma, uncomplicated: Secondary | ICD-10-CM | POA: Insufficient documentation

## 2018-02-09 DIAGNOSIS — Z79899 Other long term (current) drug therapy: Secondary | ICD-10-CM | POA: Insufficient documentation

## 2018-02-09 DIAGNOSIS — O133 Gestational [pregnancy-induced] hypertension without significant proteinuria, third trimester: Secondary | ICD-10-CM | POA: Insufficient documentation

## 2018-02-09 DIAGNOSIS — O26893 Other specified pregnancy related conditions, third trimester: Secondary | ICD-10-CM | POA: Insufficient documentation

## 2018-02-09 DIAGNOSIS — Z3A35 35 weeks gestation of pregnancy: Secondary | ICD-10-CM | POA: Diagnosis not present

## 2018-02-09 DIAGNOSIS — R102 Pelvic and perineal pain: Secondary | ICD-10-CM | POA: Insufficient documentation

## 2018-02-09 DIAGNOSIS — O4700 False labor before 37 completed weeks of gestation, unspecified trimester: Secondary | ICD-10-CM

## 2018-02-09 DIAGNOSIS — Z3480 Encounter for supervision of other normal pregnancy, unspecified trimester: Secondary | ICD-10-CM

## 2018-02-09 DIAGNOSIS — O09299 Supervision of pregnancy with other poor reproductive or obstetric history, unspecified trimester: Secondary | ICD-10-CM

## 2018-02-09 DIAGNOSIS — Z3483 Encounter for supervision of other normal pregnancy, third trimester: Secondary | ICD-10-CM

## 2018-02-09 DIAGNOSIS — Z7982 Long term (current) use of aspirin: Secondary | ICD-10-CM | POA: Insufficient documentation

## 2018-02-09 DIAGNOSIS — N949 Unspecified condition associated with female genital organs and menstrual cycle: Secondary | ICD-10-CM

## 2018-02-09 DIAGNOSIS — O479 False labor, unspecified: Secondary | ICD-10-CM

## 2018-02-09 DIAGNOSIS — O3433 Maternal care for cervical incompetence, third trimester: Secondary | ICD-10-CM

## 2018-02-09 DIAGNOSIS — O99513 Diseases of the respiratory system complicating pregnancy, third trimester: Secondary | ICD-10-CM | POA: Insufficient documentation

## 2018-02-09 DIAGNOSIS — O10913 Unspecified pre-existing hypertension complicating pregnancy, third trimester: Secondary | ICD-10-CM

## 2018-02-09 DIAGNOSIS — N898 Other specified noninflammatory disorders of vagina: Secondary | ICD-10-CM | POA: Diagnosis present

## 2018-02-09 DIAGNOSIS — Z8249 Family history of ischemic heart disease and other diseases of the circulatory system: Secondary | ICD-10-CM | POA: Insufficient documentation

## 2018-02-09 DIAGNOSIS — O10919 Unspecified pre-existing hypertension complicating pregnancy, unspecified trimester: Secondary | ICD-10-CM

## 2018-02-09 DIAGNOSIS — O99013 Anemia complicating pregnancy, third trimester: Secondary | ICD-10-CM

## 2018-02-09 LAB — URINALYSIS, ROUTINE W REFLEX MICROSCOPIC
Bilirubin Urine: NEGATIVE
GLUCOSE, UA: NEGATIVE mg/dL
HGB URINE DIPSTICK: NEGATIVE
Ketones, ur: NEGATIVE mg/dL
NITRITE: NEGATIVE
PROTEIN: NEGATIVE mg/dL
SPECIFIC GRAVITY, URINE: 1.016 (ref 1.005–1.030)
pH: 7 (ref 5.0–8.0)

## 2018-02-09 LAB — GROUP B STREP BY PCR: Group B strep by PCR: NEGATIVE

## 2018-02-09 LAB — CBC
HCT: 31.3 % — ABNORMAL LOW (ref 36.0–46.0)
Hemoglobin: 10.3 g/dL — ABNORMAL LOW (ref 12.0–15.0)
MCH: 27.4 pg (ref 26.0–34.0)
MCHC: 32.9 g/dL (ref 30.0–36.0)
MCV: 83.2 fL (ref 78.0–100.0)
Platelets: 228 10*3/uL (ref 150–400)
RBC: 3.76 MIL/uL — ABNORMAL LOW (ref 3.87–5.11)
RDW: 14.1 % (ref 11.5–15.5)
WBC: 10 10*3/uL (ref 4.0–10.5)

## 2018-02-09 LAB — TYPE AND SCREEN
ABO/RH(D): B POS
Antibody Screen: NEGATIVE

## 2018-02-09 LAB — OB RESULTS CONSOLE GBS: GBS: NEGATIVE

## 2018-02-09 MED ORDER — SODIUM CHLORIDE 0.9 % IV SOLN
5.0000 10*6.[IU] | Freq: Once | INTRAVENOUS | Status: AC
  Administered 2018-02-09: 5 10*6.[IU] via INTRAVENOUS
  Filled 2018-02-09: qty 5

## 2018-02-09 MED ORDER — NIFEDIPINE 10 MG PO CAPS
10.0000 mg | ORAL_CAPSULE | ORAL | Status: DC | PRN
  Administered 2018-02-09 (×2): 10 mg via ORAL
  Filled 2018-02-09 (×2): qty 1

## 2018-02-09 MED ORDER — OXYTOCIN 40 UNITS IN LACTATED RINGERS INFUSION - SIMPLE MED: 2.5000 [IU]/h | INTRAVENOUS | Status: DC

## 2018-02-09 MED ORDER — SODIUM CHLORIDE 0.9 % IV SOLN
5.0000 10*6.[IU] | Freq: Once | INTRAVENOUS | Status: DC
  Filled 2018-02-09: qty 5

## 2018-02-09 MED ORDER — OXYCODONE-ACETAMINOPHEN 5-325 MG PO TABS
1.0000 | ORAL_TABLET | Freq: Once | ORAL | Status: DC
Start: 2018-02-09 — End: 2018-02-09

## 2018-02-09 MED ORDER — LIDOCAINE HCL (PF) 1 % IJ SOLN: 30.0000 mL | INTRAMUSCULAR | Status: DC | PRN

## 2018-02-09 MED ORDER — LACTATED RINGERS IV SOLN: 500.0000 mL | INTRAVENOUS | Status: DC | PRN

## 2018-02-09 MED ORDER — OXYCODONE-ACETAMINOPHEN 5-325 MG PO TABS: 1.0000 | ORAL_TABLET | ORAL | Status: DC | PRN

## 2018-02-09 MED ORDER — PENICILLIN G POTASSIUM 5000000 UNITS IJ SOLR
2.5000 10*6.[IU] | INTRAVENOUS | Status: DC
  Filled 2018-02-09: qty 2.5

## 2018-02-09 MED ORDER — BETAMETHASONE SOD PHOS & ACET 6 (3-3) MG/ML IJ SUSP
12.0000 mg | Freq: Once | INTRAMUSCULAR | Status: AC
  Administered 2018-02-09: 12 mg via INTRAMUSCULAR
  Filled 2018-02-09: qty 2

## 2018-02-09 MED ORDER — OXYTOCIN BOLUS FROM INFUSION: 500.0000 mL | Freq: Once | INTRAVENOUS | Status: DC

## 2018-02-09 MED ORDER — ONDANSETRON HCL 4 MG/2ML IJ SOLN: 4.0000 mg | Freq: Four times a day (QID) | INTRAMUSCULAR | Status: DC | PRN

## 2018-02-09 MED ORDER — PENICILLIN G POT IN DEXTROSE 60000 UNIT/ML IV SOLN
3.0000 10*6.[IU] | INTRAVENOUS | Status: DC
  Administered 2018-02-10 (×2): 3 10*6.[IU] via INTRAVENOUS
  Filled 2018-02-09 (×4): qty 50

## 2018-02-09 MED ORDER — NALBUPHINE HCL 10 MG/ML IJ SOLN
10.0000 mg | Freq: Once | INTRAMUSCULAR | Status: AC
  Administered 2018-02-09: 10 mg via INTRAVENOUS
  Filled 2018-02-09: qty 1

## 2018-02-09 MED ORDER — FENTANYL CITRATE (PF) 100 MCG/2ML IJ SOLN
50.0000 ug | INTRAMUSCULAR | Status: DC | PRN
  Administered 2018-02-10 (×2): 100 ug via INTRAVENOUS
  Filled 2018-02-09 (×2): qty 2

## 2018-02-09 MED ORDER — SOD CITRATE-CITRIC ACID 500-334 MG/5ML PO SOLN: 30.0000 mL | ORAL | Status: DC | PRN

## 2018-02-09 MED ORDER — LACTATED RINGERS IV SOLN
INTRAVENOUS | Status: DC
  Administered 2018-02-09 – 2018-02-10 (×3): via INTRAVENOUS

## 2018-02-09 MED ORDER — ACETAMINOPHEN 325 MG PO TABS: 650.0000 mg | ORAL_TABLET | ORAL | Status: DC | PRN

## 2018-02-09 MED ORDER — BETAMETHASONE SOD PHOS & ACET 6 (3-3) MG/ML IJ SUSP
12.0000 mg | Freq: Once | INTRAMUSCULAR | Status: DC
  Filled 2018-02-09: qty 2

## 2018-02-09 MED ORDER — PROMETHAZINE HCL 25 MG/ML IJ SOLN
12.5000 mg | Freq: Once | INTRAMUSCULAR | Status: AC
  Administered 2018-02-09: 12.5 mg via INTRAVENOUS
  Filled 2018-02-09: qty 1

## 2018-02-09 MED ORDER — OXYCODONE-ACETAMINOPHEN 5-325 MG PO TABS
1.0000 | ORAL_TABLET | ORAL | 0 refills | Status: DC | PRN
Start: 2018-02-09 — End: 2018-02-15

## 2018-02-09 MED ORDER — OXYCODONE-ACETAMINOPHEN 5-325 MG PO TABS: 2.0000 | ORAL_TABLET | ORAL | Status: DC | PRN

## 2018-02-09 MED FILL — OXYCODONE-ACETAMINOPHEN 5-3: 5-325 | 5 days supply | Qty: 20 | Fill #0

## 2018-02-09 NOTE — MAU Note (Signed)
Pt reports she has been having a lot of contractions, pelvic pain , back pain. Seen in office today and was having a lot contractions. SVE 1-2
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
## 2018-02-10 LAB — RPR: RPR Ser Ql: NONREACTIVE

## 2018-02-10 MED ORDER — PROMETHAZINE HCL 50 MG PO TABS: 50.0000 mg | ORAL_TABLET | Freq: Four times a day (QID) | ORAL | 0 refills | Status: DC | PRN

## 2018-02-10 MED ORDER — PROMETHAZINE HCL 25 MG/ML IJ SOLN
25.0000 mg | Freq: Once | INTRAMUSCULAR | Status: AC
  Administered 2018-02-10: 25 mg via INTRAVENOUS
  Filled 2018-02-10: qty 1

## 2018-02-10 MED ORDER — NALBUPHINE HCL 10 MG/ML IJ SOLN
10.0000 mg | INTRAMUSCULAR | Status: DC | PRN
  Administered 2018-02-10: 10 mg via INTRAVENOUS
  Filled 2018-02-10 (×2): qty 1

## 2018-02-10 MED FILL — CYCLOBENZAPRINE 10 MG TAB: 10 | 10 days supply | Qty: 30 | Fill #0

## 2018-02-10 NOTE — Anesthesia Pain Management Evaluation Note (Signed)
  CRNA Pain Management Visit Note  Patient: Sydney Henry, 27 y.o., female  "Hello I am a [facility] of the [facility]. We have an anesthesia team available at all times to provide care throughout the hospital, including epidural management and anesthesia for C-section. I don't know your plan for the delivery whether it a natural birth, water birth, IV sedation, nitrous supplementation, doula or epidural, but we want to meet your pain goals."   1.Was your pain managed to your expectations on prior hospitalizations?   Yes   2.What is your expectation for pain management during this hospitalization?     Epidural  3.How can we help you reach that goal? Epidural when ready  Record the patient's initial score and the patient's pain goal.   Pain: 5  Pain Goal: 8 [facility] wants you to be able to say your pain was always managed very well.  Sydney Henry,Sydney Henry 02/10/2018

## 2018-02-10 NOTE — Progress Notes (Signed)
Sydney Henry is a 27 y.o. [phone] at 4761w0d by LMP admitted for Preterm labor  Subjective:  Comfortable after IVP Phenergan  Objective: BP 132/78   Pulse 69   Temp 98.4 F (36.9 C) (Oral)   Resp 16   Ht 5\' 8"  (1.727 m)   Wt 217 lb (98.4 kg)   LMP 06/10/2017   SpO2 100%   BMI 32.99 kg/m  No intake/output data recorded. No intake/output data recorded.  FHT:  FHR: 150 bpm, variability: moderate,  accelerations:  Present,  decelerations:  Absent UC:   none SVE:   Dilation: 3 Effacement (%): 50 Station: -3 Exam by:: Katherina Rightenny, CNM  Labs: Lab Results  Component Value Date   WBC 10.0 02/09/2018   HGB 10.3 (L) 02/09/2018   HCT 31.3 (L) 02/09/2018   MCV 83.2 02/09/2018   PLT 228 02/09/2018    Assessment / Plan: PTL stopped,    Labor: stopped after IVF and pain medications, SVE stable at 3 cm  Plan: Discharge home.  IOL at 37 wks d/t GHTN per Dr. Alvester MorinNewton.  POC: discharge home in stable condition.  F/U in office for 2nd dose of BMZ.  Has outpatient US scheduled for 02/11/18.  IOL scheduled for July 3rd, 2019 at 07:30.     Roe Coombsachelle A Bevan Vu, CNM 02/10/2018, 2:12 PM

## 2018-02-10 NOTE — Discharge Instructions (Signed)

## 2018-02-10 NOTE — Discharge Summary (Signed)
OB Discharge Summary     Patient Name: Sydney Henry DOB: 1991/01/03 MRN: [id number]  Date of admission: 02/09/2018  Date of discharge: 02/10/2018  Admitting diagnosis: 34.6wks preterm ctx Intrauterine pregnancy: 9546w0d     Secondary diagnosis:  Active Problems:   Preterm uterine contractions in third trimester, antepartum  Additional problems: GHTN     Dis[facility]: [facility]                                                                                                 Hospital course:  Observation for GHTN (no severe features) and preterm cervical change/contractions  Physical exam  Vitals:   02/10/18 0740 02/10/18 0934 02/10/18 1128 02/10/18 1410  BP: 122/80 (!) 145/80 132/78 140/70  Pulse: 64 70 69 72  Resp: 16 16 16 16   Temp: 98 F (36.7 C)  98.4 F (36.9 C)   TempSrc: Oral  Oral   SpO2:      Weight:      Height:       General: alert, cooperative and no distress Lochia: none Uterine Fundus: soft Incision: N/A DVT Evaluation: No evidence of DVT seen on physical exam. No cords or calf tenderness. No significant calf/ankle edema. Labs: Lab Results  Component Value Date   WBC 10.0 02/09/2018   HGB 10.3 (L) 02/09/2018   HCT 31.3 (L) 02/09/2018   MCV 83.2 02/09/2018   PLT 228 02/09/2018   CMP Latest Ref Rng & Units 02/02/2018  Glucose 65 - 99 mg/dL 68  BUN 6 - 20 mg/dL 4(L)  Creatinine 5.280.57 - 1.00 mg/dL 4.13(K0.49(L)  Sodium 440134 - 102144 mmol/L 138  Potassium 3.5 - 5.2 mmol/L 3.7  Chloride 96 - 106 mmol/L 105  CO2 20 - 29 mmol/L 19(L)  Calcium 8.7 - 10.2 mg/dL 7.2(Z8.6(L)  Total Protein 6.0 - 8.5 g/dL 6.2  Total Bilirubin 0.0 - 1.2 mg/dL <3.6<0.2  Alkaline Phos 39 - 117 IU/L 91  AST 0 - 40 IU/L 19  ALT 0 - 32 IU/L 16    Discharge instruction: per After Visit Summary and Preterm labor information.   After visit meds:  Allergies as of 02/10/2018   No Known Allergies     Medication List    TAKE these medications   albuterol 108  (90 Base) MCG/ACT inhaler Commonly known as:  PROVENTIL HFA;VENTOLIN HFA Inhale 2 puffs into the lungs every 6 (six) hours as needed for wheezing or shortness of breath.   aspirin EC 81 MG tablet Take 1 tablet (81 mg total) by mouth daily. Take after 12 weeks for prevention of preeclampsia later in pregnancy   CITRANATAL BLOOM 90-1 MG Tabs Take 1 tablet by mouth daily.   cyclobenzaprine 10 MG tablet Commonly known as:  FLEXERIL Take 1 tablet (10 mg total) by mouth every 8 (eight) hours as needed for muscle spasms.   oxyCODONE-acetaminophen 5-325 MG tablet Commonly known as:  PERCOCET/ROXICET Take 1 tablet by mouth every 4 (four) hours as needed for severe pain.   promethazine 50 MG tablet Commonly known as:  PHENERGAN Take 1  tablet (50 mg total) by mouth every 6 (six) hours as needed for nausea or vomiting.       Diet: routine diet  Activity: Advance as tolerated. Pelvic rest for 6 weeks.   Outpatient follow [id number] in office for second dose of BMZ and routiene antenatal care with NSTs as scheduled.  Has [place] scheduled for 02/08/18 Follow up Appt: Future Appointments  Date Time Provider Department Center  02/11/2018  9:15 AM [facility] [place] 4 [facility]-US  02/15/2018  2:00 PM [facility] ULTRASOUND [facility] None  02/15/2018  3:15 PM Conan Bowens, MD [facility] None  02/18/2018  1:30 PM [facility] NURSE [facility] None  02/24/2018  7:30 AM [facility] ROOM [facility] None   Follow up Visit: as listed  POC discussed with Dr. Alvester Morin.  Stable for discharge home.     02/10/2018 Roe Coombs, CNM

## 2018-02-10 NOTE — Progress Notes (Signed)
Labor Progress Note Sydney Henry is a 27 y.o. [id number] at 6251w0d presented for PTL on 02/09/18. S:  Increased pain with occasional contractions.  Has tried Fentanyl IVP.   O:  BP (!) 145/80   Pulse 70   Temp 98 F (36.7 C) (Oral)   Resp 16   Ht 5\' 8"  (1.727 m)   Wt 217 lb (98.4 kg)   LMP 06/10/2017   SpO2 100%   BMI 32.99 kg/m  EFM: 145/+accels/no decels/no contractions  CVE: Dilation: 3.5 Effacement (%): 50 Cervical Position: Posterior Station: -3 Presentation: Vertex Exam by:: B Boyer RN    A&P: 27 y.o. [id number] 1251w0d by LMP admitted for Preterm contractions on 02/09/18.  Probable GHTN versus CHTN.  Per Dr. Alvester MorinNewton stable SVE without change for several hours.  OK to discharge in a few hours after next dose of BMZ provided that her cervix has not changed dilation.  No severe range blood pressures or features reported.   #GBS not done, received 1 dose of IV Penacillin for unknown GBS status, IV hep locked.   P:  SVE later this afternoon, pending no change ok to discharge home with IOL at 37 weeks per Dr. Alvester MorinNewton.  Sydney Henry, CNM 11:45 AM

## 2018-02-10 NOTE — Progress Notes (Signed)
Patient ID: Sydney RobinsAlika D Jake, female   DOB: 09/30/1990, 27 y.o.   MRN: [id number] Sydney Robinslika D Henry is a 27 y.o. [phone] at 6668w0d admitted for Preterm labor  Subjective: Received 1 dose of nubain in MAU, pain resolved until just now, beginning to feel uc's again, requesting pain meds  Objective: BP 129/79   Pulse 91   Temp 98.6 F (37 C) (Oral)   Resp 17   Ht 5\' 8"  (1.727 m)   Wt 98.4 kg (217 lb)   LMP 06/10/2017   SpO2 100%   BMI 32.99 kg/m  No intake/output data recorded.  FHT:  FHR: 135bpm, variability: moderate,  accelerations:  Present,  decelerations:  Absent UC:   Not tracing well  SVE:   Dilation: 3 Effacement (%): 80 Station: -2 Exam by:: OmnicomBooker, CNM  Labs: Lab Results  Component Value Date   WBC 10.0 02/09/2018   HGB 10.3 (L) 02/09/2018   HCT 31.3 (L) 02/09/2018   MCV 83.2 02/09/2018   PLT 228 02/09/2018    Assessment / Plan: PTL, slow change, requesting pain meds, s/p BMZ x 1 @ 2008  Labor: early Fetal Wellbeing:  Category I Pain Control:  IV pain meds Pre-eclampsia: n/a I/D:  PCN for GBS unknown/PTL Anticipated MOD:  NSVD  Cheral MarkerKimberly R Tavaria Mackins CNM, WHNP-BC 02/10/2018, 1:36 AM

## 2018-02-11 ENCOUNTER — Other Ambulatory Visit: Payer: Self-pay | Admitting: Certified Nurse Midwife

## 2018-02-11 ENCOUNTER — Telehealth: Payer: Self-pay | Admitting: Obstetrics and Gynecology

## 2018-02-11 ENCOUNTER — Ambulatory Visit (INDEPENDENT_AMBULATORY_CARE_PROVIDER_SITE_OTHER): Payer: BLUE CROSS/BLUE SHIELD

## 2018-02-11 ENCOUNTER — Encounter (HOSPITAL_COMMUNITY): Payer: Self-pay | Admitting: *Deleted

## 2018-02-11 ENCOUNTER — Ambulatory Visit (HOSPITAL_COMMUNITY)
Admission: RE | Admit: 2018-02-11 | Discharge: 2018-02-11 | Disposition: A | Discharge status: Home / Self Care | Payer: BLUE CROSS/BLUE SHIELD | Source: Ambulatory Visit | Attending: Certified Nurse Midwife | Admitting: Certified Nurse Midwife

## 2018-02-11 ENCOUNTER — Encounter (HOSPITAL_COMMUNITY): Payer: Self-pay

## 2018-02-11 ENCOUNTER — Telehealth (HOSPITAL_COMMUNITY): Payer: Self-pay | Admitting: *Deleted

## 2018-02-11 DIAGNOSIS — O4703 False labor before 37 completed weeks of gestation, third trimester: Secondary | ICD-10-CM | POA: Diagnosis not present

## 2018-02-11 DIAGNOSIS — O09299 Supervision of pregnancy with other poor reproductive or obstetric history, unspecified trimester: Secondary | ICD-10-CM

## 2018-02-11 DIAGNOSIS — O09293 Supervision of pregnancy with other poor reproductive or obstetric history, third trimester: Secondary | ICD-10-CM | POA: Insufficient documentation

## 2018-02-11 DIAGNOSIS — O133 Gestational [pregnancy-induced] hypertension without significant proteinuria, third trimester: Secondary | ICD-10-CM

## 2018-02-11 DIAGNOSIS — Z3A35 35 weeks gestation of pregnancy: Secondary | ICD-10-CM | POA: Insufficient documentation

## 2018-02-11 LAB — SPECIMEN STATUS REPORT

## 2018-02-11 LAB — CERVICOVAGINAL ANCILLARY ONLY
BACTERIAL VAGINITIS: NEGATIVE
CANDIDA VAGINITIS: NEGATIVE
Chlamydia: NEGATIVE
Neisseria Gonorrhea: NEGATIVE
Trichomonas: POSITIVE — AB

## 2018-02-11 LAB — FERRITIN: FERRITIN: 9 ng/mL — AB (ref 15–150)

## 2018-02-11 MED ORDER — BETAMETHASONE SOD PHOS & ACET 6 (3-3) MG/ML IJ SUSP
12.0000 mg | Freq: Once | INTRAMUSCULAR | Status: AC
  Administered 2018-02-11: 12 mg via INTRAMUSCULAR

## 2018-02-11 MED FILL — PROMETHAZINE 25 MG TABLET: 25 | 8 days supply | Qty: 60 | Fill #0

## 2018-02-11 NOTE — Progress Notes (Signed)
Nurse visit for 2nd BMZ injection given R upper outer quad w/o difficulty. Used office supply

## 2018-02-11 NOTE — Telephone Encounter (Signed)
Preadmission screen  

## 2018-02-11 NOTE — Telephone Encounter (Signed)
TC from pharmacy requesting a change in dose for Phenergan 50 mg to Phenergan 25 mg d/t prescribed dose not in stock at the pharmacy. Verbal orders given to change dose to 25 mg.   Raelyn MoraRolitta Layaan Mott, CNM  02/11/2018 4:01 PM

## 2018-02-12 ENCOUNTER — Other Ambulatory Visit: Payer: Self-pay | Admitting: Certified Nurse Midwife

## 2018-02-12 DIAGNOSIS — O23593 Infection of other part of genital tract in pregnancy, third trimester: Principal | ICD-10-CM

## 2018-02-12 DIAGNOSIS — A5901 Trichomonal vulvovaginitis: Secondary | ICD-10-CM

## 2018-02-12 MED ORDER — METRONIDAZOLE 500 MG PO TABS: 2000.0000 mg | ORAL_TABLET | Freq: Once | ORAL | 0 refills | Status: AC

## 2018-02-12 MED FILL — metroNIDAZOLE 500 MG TABS: 500 | 1 days supply | Qty: 4 | Fill #0

## 2018-02-15 ENCOUNTER — Encounter: Payer: Self-pay | Admitting: Obstetrics and Gynecology

## 2018-02-15 ENCOUNTER — Ambulatory Visit (INDEPENDENT_AMBULATORY_CARE_PROVIDER_SITE_OTHER): Payer: BLUE CROSS/BLUE SHIELD | Admitting: Obstetrics and Gynecology

## 2018-02-15 ENCOUNTER — Inpatient Hospital Stay (HOSPITAL_COMMUNITY): Payer: BLUE CROSS/BLUE SHIELD

## 2018-02-15 ENCOUNTER — Inpatient Hospital Stay (HOSPITAL_COMMUNITY)
Admission: AD | Admit: 2018-02-15 | Discharge: 2018-02-15 | Disposition: A | Discharge status: Home / Self Care | Payer: BLUE CROSS/BLUE SHIELD | Source: Ambulatory Visit | Attending: Obstetrics and Gynecology | Admitting: Obstetrics and Gynecology

## 2018-02-15 ENCOUNTER — Other Ambulatory Visit: Payer: BLUE CROSS/BLUE SHIELD

## 2018-02-15 ENCOUNTER — Other Ambulatory Visit: Payer: Self-pay

## 2018-02-15 ENCOUNTER — Encounter (HOSPITAL_COMMUNITY): Payer: Self-pay | Admitting: *Deleted

## 2018-02-15 VITALS — BP 149/93 | HR 97 | Wt 220.4 lb

## 2018-02-15 DIAGNOSIS — E669 Obesity, unspecified: Secondary | ICD-10-CM | POA: Insufficient documentation

## 2018-02-15 DIAGNOSIS — O09293 Supervision of pregnancy with other poor reproductive or obstetric history, third trimester: Secondary | ICD-10-CM | POA: Diagnosis not present

## 2018-02-15 DIAGNOSIS — R87810 Cervical high risk human papillomavirus (HPV) DNA test positive: Secondary | ICD-10-CM

## 2018-02-15 DIAGNOSIS — O10913 Unspecified pre-existing hypertension complicating pregnancy, third trimester: Secondary | ICD-10-CM

## 2018-02-15 DIAGNOSIS — A599 Trichomoniasis, unspecified: Secondary | ICD-10-CM

## 2018-02-15 DIAGNOSIS — R87610 Atypical squamous cells of undetermined significance on cytologic smear of cervix (ASC-US): Secondary | ICD-10-CM

## 2018-02-15 DIAGNOSIS — Z3A35 35 weeks gestation of pregnancy: Secondary | ICD-10-CM | POA: Diagnosis not present

## 2018-02-15 DIAGNOSIS — Z3480 Encounter for supervision of other normal pregnancy, unspecified trimester: Secondary | ICD-10-CM

## 2018-02-15 DIAGNOSIS — O288 Other abnormal findings on antenatal screening of mother: Secondary | ICD-10-CM | POA: Insufficient documentation

## 2018-02-15 DIAGNOSIS — O10919 Unspecified pre-existing hypertension complicating pregnancy, unspecified trimester: Secondary | ICD-10-CM

## 2018-02-15 DIAGNOSIS — O09299 Supervision of pregnancy with other poor reproductive or obstetric history, unspecified trimester: Secondary | ICD-10-CM

## 2018-02-15 DIAGNOSIS — O139 Gestational [pregnancy-induced] hypertension without significant proteinuria, unspecified trimester: Secondary | ICD-10-CM | POA: Diagnosis not present

## 2018-02-15 DIAGNOSIS — O99210 Obesity complicating pregnancy, unspecified trimester: Secondary | ICD-10-CM | POA: Insufficient documentation

## 2018-02-15 HISTORY — DX: Reserved for concepts with insufficient information to code with codable children: IMO0002

## 2018-02-15 LAB — COMPREHENSIVE METABOLIC PANEL
ALT: 12 U/L — AB (ref 14–54)
AST: 22 U/L (ref 15–41)
Albumin: 3 g/dL — ABNORMAL LOW (ref 3.5–5.0)
Alkaline Phosphatase: 107 U/L (ref 38–126)
Anion gap: 10 (ref 5–15)
BUN: 5 mg/dL — AB (ref 6–20)
CALCIUM: 8.4 mg/dL — AB (ref 8.9–10.3)
CHLORIDE: 104 mmol/L (ref 101–111)
CO2: 19 mmol/L — ABNORMAL LOW (ref 22–32)
CREATININE: 0.56 mg/dL (ref 0.44–1.00)
Glucose, Bld: 116 mg/dL — ABNORMAL HIGH (ref 65–99)
Potassium: 3.1 mmol/L — ABNORMAL LOW (ref 3.5–5.1)
Sodium: 133 mmol/L — ABNORMAL LOW (ref 135–145)
Total Bilirubin: 0.4 mg/dL (ref 0.3–1.2)
Total Protein: 6.5 g/dL (ref 6.5–8.1)

## 2018-02-15 LAB — URINALYSIS, ROUTINE W REFLEX MICROSCOPIC
BILIRUBIN URINE: NEGATIVE
Glucose, UA: NEGATIVE mg/dL
Hgb urine dipstick: NEGATIVE
Ketones, ur: NEGATIVE mg/dL
NITRITE: NEGATIVE
PH: 6 (ref 5.0–8.0)
Protein, ur: NEGATIVE mg/dL
SPECIFIC GRAVITY, URINE: 1.013 (ref 1.005–1.030)

## 2018-02-15 LAB — CBC
HEMATOCRIT: 30.4 % — AB (ref 36.0–46.0)
Hemoglobin: 9.9 g/dL — ABNORMAL LOW (ref 12.0–15.0)
MCH: 27 pg (ref 26.0–34.0)
MCHC: 32.6 g/dL (ref 30.0–36.0)
MCV: 82.8 fL (ref 78.0–100.0)
PLATELETS: 236 10*3/uL (ref 150–400)
RBC: 3.67 MIL/uL — AB (ref 3.87–5.11)
RDW: 14.1 % (ref 11.5–15.5)
WBC: 9.8 10*3/uL (ref 4.0–10.5)

## 2018-02-15 LAB — PROTEIN / CREATININE RATIO, URINE
CREATININE, URINE: 103 mg/dL
Protein Creatinine Ratio: 0.18 mg/mg{Cre} — ABNORMAL HIGH (ref 0.00–0.15)
TOTAL PROTEIN, URINE: 19 mg/dL

## 2018-02-15 LAB — AMNISURE RUPTURE OF MEMBRANE (ROM) NOT AT ARMC: AMNISURE: NEGATIVE

## 2018-02-15 MED ORDER — ACETAMINOPHEN 500 MG PO TABS
1000.0000 mg | ORAL_TABLET | Freq: Once | ORAL | Status: AC
  Administered 2018-02-15: 1000 mg via ORAL
  Filled 2018-02-15: qty 2

## 2018-02-15 MED ORDER — OXYCODONE HCL 5 MG PO TABS: 5.0000 mg | ORAL_TABLET | Freq: Once | ORAL | Status: DC

## 2018-02-15 NOTE — MAU Note (Signed)
Sent over from office.  bp was elevated. "his heart rate wasn't stable".  Office called, pt for BPP.  Blood work drawn in lobby. Slight headache, has not taken anything, seeing spots at times,  Sharp - stabbing pain in RUQ, comes and goes. Some swelling in feet and ankles- not constant.  Watery d/c and spotting since cervix checked.
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
## 2018-02-15 NOTE — MAU Provider Note (Addendum)
History     CSN: [id number]  Arrival date and time: 02/15/18 1708   First Provider Initiated Contact with Patient 02/15/18 1814      Chief Complaint  Patient presents with  . Headache  . Hypertension   HPI  Sydney Henry is a 27 y.o. [phone] at [redacted]w[redacted]d who presents from the office for BPP & PEC evaluation. Patient had non reactive NST in the office. Also had elevated BP & headache in office today. Reports frontal headache since last night. Rates pain 5/10. Took percocet last night with relief but states pain returned when medicine wore off. Endorses RUQ pain that comes and goes and feel sharp. No visual disturbance. Hx of preeclampsia in previous pregnancy.  Was diagnosed with GHTN last week & has IOL scheduled at 37 wks. Was admitted to antenatal last week for preterm contractions.  Verbalizes her frustration with being sent back and forth from office to the hospital. She is worried about the baby and her symptoms.   OB History    Gravida  4   Para  1   Term  1   Preterm      AB  2   Living  1     SAB  1   TAB  1   Ectopic      Multiple      Live Births  1           Past Medical History:  Diagnosis Date  . Anemia in pregnancy 02/03/2018  . Asthma    rarely uses inhaler - seasonal  . Complication of anesthesia    Allergy to component of epidural during labor  . History of pre-eclampsia   . Hypertension    Hx 2013 pregnancy only    Past Surgical History:  Procedure Laterality Date  . DILATION AND CURETTAGE OF UTERUS    . DILATION AND EVACUATION N/A 01/28/2017   Procedure: DILATATION AND EVACUATION;  Surgeon: Federico Flake, MD;  Location: [facility];  Service: Gynecology;  Laterality: N/A;  . INDUCED ABORTION    . IUD REMOVAL N/A 01/31/2015   Procedure: INTRAUTERINE DEVICE (IUD) REMOVAL;  Surgeon: Allie Bossier, MD;  Location: [facility];  Service: Gynecology;  Laterality: N/A;  . WISDOM TOOTH EXTRACTION      Family History  Problem Relation Age of  Onset  . Diabetes Paternal Grandmother   . Hypertension Paternal Grandmother   . Cancer Paternal Grandmother   . Hypertension Father   . Diabetes Father   . Diabetes Maternal Aunt   . Hypertension Maternal Aunt   . Cancer Maternal Grandmother   . Diabetes Maternal Grandfather     Social History   Tobacco Use  . Smoking status: Former Smoker    Packs/day: 0.25    Years: 5.00    Pack years: 1.25    Types: Cigarettes    Last attempt to quit: 12/23/2016    Years since quitting: 1.1  . Smokeless tobacco: Never Used  Substance Use Topics  . Alcohol use: No    Frequency: Never    Comment: social but none since 11/2016  . Drug use: Not Currently    Frequency: 7.0 times per week    Comment: last used marijuana 01/28/17    Allergies: No Known Allergies  Medications Prior to Admission  Medication Sig Dispense Refill Last Dose  . albuterol (PROVENTIL HFA;VENTOLIN HFA) 108 (90 Base) MCG/ACT inhaler Inhale 2 puffs into the lungs every 6 (six) hours as needed for  wheezing or shortness of breath.   Not Taking  . aspirin EC 81 MG tablet Take 1 tablet (81 mg total) by mouth daily. Take after 12 weeks for prevention of preeclampsia later in pregnancy 300 tablet 2 Taking  . cyclobenzaprine (FLEXERIL) 10 MG tablet Take 1 tablet (10 mg total) by mouth every 8 (eight) hours as needed for muscle spasms. (Patient not taking: Reported on 02/10/2018) 30 tablet 1 Not Taking  . oxyCODONE-acetaminophen (PERCOCET/ROXICET) 5-325 MG tablet Take 1 tablet by mouth every 4 (four) hours as needed for severe pain. (Patient not taking: Reported on 02/11/2018) 20 tablet 0 Not Taking  . Prenatal-DSS-FeCb-FeGl-FA (CITRANATAL BLOOM) 90-1 MG TABS Take 1 tablet by mouth daily. 30 tablet 12 Taking  . promethazine (PHENERGAN) 50 MG tablet Take 1 tablet (50 mg total) by mouth every 6 (six) hours as needed for nausea or vomiting. 30 tablet 0 Taking    Review of Systems  Constitutional: Negative.   Eyes: Negative for  photophobia.  Gastrointestinal: Negative.   Genitourinary: Negative.   Neurological: Positive for headaches.   Physical Exam   Blood pressure 127/86, pulse (!) 106, temperature 99.1 F (37.3 C), temperature source Oral, resp. rate 16, last menstrual period 06/10/2017, SpO2 100 %, unknown if currently breastfeeding. Patient Vitals for the past 24 hrs:  BP Temp Temp src Pulse Resp SpO2  02/15/18 1915 127/86 - - (!) 106 - -  02/15/18 1900 133/88 - - (!) 101 - -  02/15/18 1845 122/79 - - (!) 135 - -  02/15/18 1830 137/88 - - (!) 102 - -  02/15/18 1815 133/84 - - (!) 121 - -  02/15/18 1810 (!) 140/95 - - (!) 115 - -  02/15/18 1747 (!) 134/94 99.1 F (37.3 C) Oral (!) 112 16 100 %  02/15/18 1745 - - - - - 100 %    Physical Exam  Nursing note and vitals reviewed. Constitutional: She is oriented to person, place, and time. She appears well-developed and well-nourished. No distress.  HENT:  Head: Normocephalic and atraumatic.  Eyes: Conjunctivae are normal. Right eye exhibits no discharge. Left eye exhibits no discharge. No scleral icterus.  Neck: Normal range of motion.  Cardiovascular: Normal rate, regular rhythm and normal heart sounds.  No murmur heard. Respiratory: Effort normal and breath sounds normal. No respiratory distress. She has no wheezes.  GI: Soft. There is no tenderness.  Musculoskeletal: She exhibits no edema.  Neurological: She is alert and oriented to person, place, and time. She has normal reflexes.  No clonus  Skin: Skin is warm and dry. She is not diaphoretic.  Psychiatric: She has a normal mood and affect. Her behavior is normal. Judgment and thought content normal.    MAU Course  Procedures Results for orders placed or performed during the hospital encounter of 02/15/18 (from the past 24 hour(s))  CBC     Status: Abnormal   Collection Time: 02/15/18  5:38 PM  Result Value Ref Range   WBC 9.8 4.0 - 10.5 K/uL   RBC 3.67 (L) 3.87 - 5.11 MIL/uL   Hemoglobin  9.9 (L) 12.0 - 15.0 g/dL   HCT 19.1 (L) 47.8 - 29.5 %   MCV 82.8 78.0 - 100.0 fL   MCH 27.0 26.0 - 34.0 pg   MCHC 32.6 30.0 - 36.0 g/dL   RDW 62.1 30.8 - 65.7 %   Platelets 236 150 - 400 K/uL  Comprehensive metabolic panel     Status: Abnormal   Collection Time:  02/15/18  5:38 PM  Result Value Ref Range   Sodium 133 (L) 135 - 145 mmol/L   Potassium 3.1 (L) 3.5 - 5.1 mmol/L   Chloride 104 101 - 111 mmol/L   CO2 19 (L) 22 - 32 mmol/L   Glucose, Bld 116 (H) 65 - 99 mg/dL   BUN 5 (L) 6 - 20 mg/dL   Creatinine, Ser 4.540.56 0.44 - 1.00 mg/dL   Calcium 8.4 (L) 8.9 - 10.3 mg/dL   Total Protein 6.5 6.5 - 8.1 g/dL   Albumin 3.0 (L) 3.5 - 5.0 g/dL   AST 22 15 - 41 U/L   ALT 12 (L) 14 - 54 U/L   Alkaline Phosphatase 107 38 - 126 U/L   Total Bilirubin 0.4 0.3 - 1.2 mg/dL   GFR calc non Af Amer >60 >60 mL/min   GFR calc Af Amer >60 >60 mL/min   Anion gap 10 5 - 15  Protein / creatinine ratio, urine     Status: Abnormal   Collection Time: 02/15/18  5:55 PM  Result Value Ref Range   Creatinine, Urine 103.00 mg/dL   Total Protein, Urine 19 mg/dL   Protein Creatinine Ratio 0.18 (H) 0.00 - 0.15 mg/mg[Cre]  Urinalysis, Routine w reflex microscopic     Status: Abnormal   Collection Time: 02/15/18  5:56 PM  Result Value Ref Range   Color, Urine YELLOW YELLOW   APPearance CLOUDY (A) CLEAR   Specific Gravity, Urine 1.013 1.005 - 1.030   pH 6.0 5.0 - 8.0   Glucose, UA NEGATIVE NEGATIVE mg/dL   Hgb urine dipstick NEGATIVE NEGATIVE   Bilirubin Urine NEGATIVE NEGATIVE   Ketones, ur NEGATIVE NEGATIVE mg/dL   Protein, ur NEGATIVE NEGATIVE mg/dL   Nitrite NEGATIVE NEGATIVE   Leukocytes, UA LARGE (A) NEGATIVE   RBC / HPF 21-50 0 - 5 RBC/hpf   WBC, UA >50 (H) 0 - 5 WBC/hpf   Bacteria, UA RARE (A) NONE SEEN   Squamous Epithelial / LPF 21-50 0 - 5    MDM NST:  Baseline: 150 bpm, Variability: Good {> 6 bpm), Accelerations: Reactive and Decelerations: ?decel vs wandering baseline  Elevated BP, none  severe range. PEC labs pending. Tylenol 1 gm PO for headache.  Pt reports headache gone, but rates pain 5/10  BPP 8/8. AFI 6 today compared to AFI of 15 on 6/20. When questioned; pt states she's been complaining to her ob/gyn regarding leaking for the last week & was told that it was normal discharge.  Amnisure pending  D/w Dr. Vergie LivingPickens. Will give dose of oxycodone while amnisure pending (if patient states has a headache)  [facility] Lawrence, Erin, NP 02/15/2018 8:28 PM   Declines Oxycodone for HA, states HA is mild and intermittent. Amnisure negative. Discussed with Dr. Vergie LivingPickens. Will rpt AFI tomorrow in MFM- Dr Vergie LivingPickens to arrange appt. Stable for discharge home.   Assessment and Plan   1. Gestational hypertension without significant proteinuria, antepartum   2. Non-reactive NST (non-stress test)   3. [redacted] weeks gestation of pregnancy   4. Trichomonas infection   5. Hx of preeclampsia, prior pregnancy, currently pregnant    Discharge home Follow [facility] US tomorrow [facility] Pre-e precautions  Allergies as of 02/15/2018   No Known Allergies     Medication List    STOP taking these medications   oxyCODONE-acetaminophen 5-325 MG tablet Commonly known as:  PERCOCET/ROXICET  TAKE these medications   albuterol 108 (90 Base) MCG/ACT inhaler Commonly known as:  PROVENTIL HFA;VENTOLIN HFA Inhale 2 puffs into the lungs every 6 (six) hours as needed for wheezing or shortness of breath.   aspirin EC 81 MG tablet Take 1 tablet (81 mg total) by mouth daily. Take after 12 weeks for prevention of preeclampsia later in pregnancy   CITRANATAL BLOOM 90-1 MG Tabs Take 1 tablet by mouth daily.   cyclobenzaprine 10 MG tablet Commonly known as:  FLEXERIL Take 1 tablet (10 mg total) by mouth every 8 (eight) hours as needed for muscle spasms.   promethazine 50 MG tablet Commonly known as:  PHENERGAN Take 1 tablet (50 mg total) by  mouth every 6 (six) hours as needed for nausea or vomiting.      Donette Larry, CNM  02/15/2018 9:20 PM

## 2018-02-15 NOTE — Discharge Instructions (Signed)

## 2018-02-16 ENCOUNTER — Encounter (HOSPITAL_COMMUNITY): Payer: Self-pay

## 2018-02-16 ENCOUNTER — Telehealth: Payer: Self-pay | Admitting: Obstetrics and Gynecology

## 2018-02-16 ENCOUNTER — Ambulatory Visit (HOSPITAL_COMMUNITY)
Admission: RE | Admit: 2018-02-16 | Discharge: 2018-02-16 | Disposition: A | Discharge status: Home / Self Care | Payer: BLUE CROSS/BLUE SHIELD | Source: Ambulatory Visit | Attending: Obstetrics and Gynecology | Admitting: Obstetrics and Gynecology

## 2018-02-16 ENCOUNTER — Other Ambulatory Visit: Payer: Self-pay | Admitting: Obstetrics and Gynecology

## 2018-02-16 DIAGNOSIS — O4103X0 Oligohydramnios, third trimester, not applicable or unspecified: Secondary | ICD-10-CM

## 2018-02-16 DIAGNOSIS — Z3689 Encounter for other specified antenatal screening: Secondary | ICD-10-CM | POA: Insufficient documentation

## 2018-02-16 DIAGNOSIS — O133 Gestational [pregnancy-induced] hypertension without significant proteinuria, third trimester: Secondary | ICD-10-CM | POA: Insufficient documentation

## 2018-02-16 DIAGNOSIS — O09293 Supervision of pregnancy with other poor reproductive or obstetric history, third trimester: Secondary | ICD-10-CM | POA: Diagnosis present

## 2018-02-16 DIAGNOSIS — Z3A35 35 weeks gestation of pregnancy: Secondary | ICD-10-CM | POA: Diagnosis not present

## 2018-02-16 DIAGNOSIS — O09299 Supervision of pregnancy with other poor reproductive or obstetric history, unspecified trimester: Secondary | ICD-10-CM

## 2018-02-16 NOTE — Telephone Encounter (Signed)
OB Telephone Note Patient called and told about repeat AFI for 1600 today.  Sydney Henry, Jr MD Attending [facility] 02/16/2018 Time: [phone]

## 2018-02-18 ENCOUNTER — Inpatient Hospital Stay (HOSPITAL_COMMUNITY): Payer: BLUE CROSS/BLUE SHIELD

## 2018-02-18 ENCOUNTER — Inpatient Hospital Stay (HOSPITAL_COMMUNITY)
Admission: AD | Admit: 2018-02-18 | Discharge: 2018-02-18 | Disposition: A | Discharge status: Home / Self Care | Payer: BLUE CROSS/BLUE SHIELD | Source: Ambulatory Visit | Attending: Obstetrics and Gynecology | Admitting: Obstetrics and Gynecology

## 2018-02-18 ENCOUNTER — Encounter (HOSPITAL_COMMUNITY): Payer: Self-pay

## 2018-02-18 ENCOUNTER — Other Ambulatory Visit: Payer: Self-pay

## 2018-02-18 ENCOUNTER — Ambulatory Visit (INDEPENDENT_AMBULATORY_CARE_PROVIDER_SITE_OTHER): Payer: BLUE CROSS/BLUE SHIELD | Admitting: *Deleted

## 2018-02-18 VITALS — BP 134/87 | HR 96 | Wt 219.0 lb

## 2018-02-18 DIAGNOSIS — B9689 Other specified bacterial agents as the cause of diseases classified elsewhere: Secondary | ICD-10-CM

## 2018-02-18 DIAGNOSIS — Z3A34 34 weeks gestation of pregnancy: Secondary | ICD-10-CM | POA: Diagnosis not present

## 2018-02-18 DIAGNOSIS — O23593 Infection of other part of genital tract in pregnancy, third trimester: Secondary | ICD-10-CM | POA: Diagnosis not present

## 2018-02-18 DIAGNOSIS — O288 Other abnormal findings on antenatal screening of mother: Secondary | ICD-10-CM | POA: Insufficient documentation

## 2018-02-18 DIAGNOSIS — O26893 Other specified pregnancy related conditions, third trimester: Secondary | ICD-10-CM | POA: Insufficient documentation

## 2018-02-18 DIAGNOSIS — J45909 Unspecified asthma, uncomplicated: Secondary | ICD-10-CM | POA: Diagnosis not present

## 2018-02-18 DIAGNOSIS — O133 Gestational [pregnancy-induced] hypertension without significant proteinuria, third trimester: Secondary | ICD-10-CM | POA: Diagnosis not present

## 2018-02-18 DIAGNOSIS — Z7982 Long term (current) use of aspirin: Secondary | ICD-10-CM | POA: Diagnosis not present

## 2018-02-18 DIAGNOSIS — N760 Acute vaginitis: Secondary | ICD-10-CM | POA: Diagnosis not present

## 2018-02-18 DIAGNOSIS — Z8249 Family history of ischemic heart disease and other diseases of the circulatory system: Secondary | ICD-10-CM | POA: Diagnosis not present

## 2018-02-18 DIAGNOSIS — Z79899 Other long term (current) drug therapy: Secondary | ICD-10-CM | POA: Insufficient documentation

## 2018-02-18 DIAGNOSIS — O99513 Diseases of the respiratory system complicating pregnancy, third trimester: Secondary | ICD-10-CM | POA: Diagnosis not present

## 2018-02-18 DIAGNOSIS — Z87891 Personal history of nicotine dependence: Secondary | ICD-10-CM | POA: Insufficient documentation

## 2018-02-18 DIAGNOSIS — Z3A36 36 weeks gestation of pregnancy: Secondary | ICD-10-CM | POA: Insufficient documentation

## 2018-02-18 DIAGNOSIS — Z3689 Encounter for other specified antenatal screening: Secondary | ICD-10-CM

## 2018-02-18 DIAGNOSIS — O09299 Supervision of pregnancy with other poor reproductive or obstetric history, unspecified trimester: Secondary | ICD-10-CM

## 2018-02-18 LAB — WET PREP, GENITAL
Sperm: NONE SEEN
Trich, Wet Prep: NONE SEEN
Yeast Wet Prep HPF POC: NONE SEEN

## 2018-02-18 MED ORDER — METRONIDAZOLE 500 MG PO TABS: 500.0000 mg | ORAL_TABLET | Freq: Four times a day (QID) | ORAL | 0 refills | Status: DC

## 2018-02-18 MED ORDER — METRONIDAZOLE 500 MG PO TABS: 500.0000 mg | ORAL_TABLET | Freq: Two times a day (BID) | ORAL | 0 refills | Status: DC

## 2018-02-18 MED FILL — metroNIDAZOLE 500 MG TABS: 500 | 7 days supply | Qty: 28 | Fill #0

## 2018-02-18 NOTE — Progress Notes (Signed)
Pt is in office today for NST for Hypertension in Pregnancy.  Pt is doing well, with no concerns today.  NST reviewed with R.Denney, CNM- noted non reactive. [facility] R.Denney.  Order has been entered. Pt to follow up in office on Monday for OB visit.

## 2018-02-18 NOTE — MAU Note (Signed)
Urine in the lab  

## 2018-02-18 NOTE — Discharge Instructions (Signed)
Vaginal Delivery Vaginal delivery means that you will give birth by pushing your baby out of your birth canal (vagina). A team of health care providers will help you before, during, and after vaginal delivery. Birth experiences are unique for every woman and every pregnancy, and birth experiences vary depending on where you choose to give birth. What should I do to prepare for my baby's birth? Before your baby is born, it is important to talk with your health care provider about:  Your labor and delivery preferences. These may include: ? Medicines that you may be given. ? How you will manage your pain. This might include non-medical pain relief techniques or injectable pain relief such as epidural analgesia. ? How you and your baby will be monitored during labor and delivery. ? Who may be in the labor and delivery room with you. ? Your feelings about surgical delivery of your baby (cesarean delivery, or C-section) if this becomes necessary. ? Your feelings about receiving donated blood through an IV tube (blood transfusion) if this becomes necessary.  Whether you are able: ? To take pictures or videos of the birth. ? To eat during labor and delivery. ? To move around, walk, or change positions during labor and delivery.  What to expect after your baby is born, such as: ? Whether delayed umbilical cord clamping and cutting is offered. ? Who will care for your baby right after birth. ? Medicines or tests that may be recommended for your baby. ? Whether breastfeeding is supported in your hospital or birth center. ? How long you will be in the hospital or birth center.  How any medical conditions you have may affect your baby or your labor and delivery experience.  To prepare for your baby's birth, you should also:  Attend all of your health care visits before delivery (prenatal visits) as recommended by your health care provider. This is important.  Prepare your home for your baby's  arrival. Make sure that you have: ? Diapers. ? Baby clothing. ? Feeding equipment. ? Safe sleeping arrangements for you and your baby.  Install a car seat in your vehicle. Have your car seat checked by a certified car seat installer to make sure that it is installed safely.  Think about who will help you with your new baby at home for at least the first several weeks after delivery.  What can I expect when I arrive at the birth center or hospital? Once you are in labor and have been admitted into the hospital or birth center, your health care provider may:  Review your pregnancy history and any concerns you have.  Insert an IV tube into one of your veins. This is used to give you fluids and medicines.  Check your blood pressure, pulse, temperature, and heart rate (vital signs).  Check whether your bag of water (amniotic sac) has broken (ruptured).  Talk with you about your birth plan and discuss pain control options.  Monitoring Your health care provider may monitor your contractions (uterine monitoring) and your baby's heart rate (fetal monitoring). You may need to be monitored:  Often, but not continuously (intermittently).  All the time or for long periods at a time (continuously). Continuous monitoring may be needed if: ? You are taking certain medicines, such as medicine to relieve pain or make your contractions stronger. ? You have pregnancy or labor complications.  Monitoring may be done by:  Placing a special stethoscope or a handheld monitoring device on your abdomen to   check your baby's heartbeat, and feeling your abdomen for contractions. This method of monitoring does not continuously record your baby's heartbeat or your contractions.  Placing monitors on your abdomen (external monitors) to record your baby's heartbeat and the frequency and length of contractions. You may not have to wear external monitors all the time.  Placing monitors inside of your uterus  (internal monitors) to record your baby's heartbeat and the frequency, length, and strength of your contractions. ? Your health care provider may use internal monitors if he or she needs more information about the strength of your contractions or your baby's heart rate. ? Internal monitors are put in place by passing a thin, flexible wire through your vagina and into your uterus. Depending on the type of monitor, it may remain in your uterus or on your baby's head until birth. ? Your health care provider will discuss the benefits and risks of internal monitoring with you and will ask for your permission before inserting the monitors.  Telemetry. This is a type of continuous monitoring that can be done with external or internal monitors. Instead of having to stay in bed, you are able to move around during telemetry. Ask your health care provider if telemetry is an option for you.  Physical exam Your health care provider may perform a physical exam. This may include:  Checking whether your baby is positioned: ? With the head toward your vagina (head-down). This is most common. ? With the head toward the top of your uterus (head-up or breech). If your baby is in a breech position, your health care provider may try to turn your baby to a head-down position so you can deliver vaginally. If it does not seem that your baby can be born vaginally, your provider may recommend surgery to deliver your baby. In rare cases, you may be able to deliver vaginally if your baby is head-up (breech delivery). ? Lying sideways (transverse). Babies that are lying sideways cannot be delivered vaginally.  Checking your cervix to determine: ? Whether it is thinning out (effacing). ? Whether it is opening up (dilating). ? How low your baby has moved into your birth canal.  What are the three stages of labor and delivery?  Normal labor and delivery is divided into the following three stages: Stage 1  Stage 1 is the  longest stage of labor, and it can last for hours or days. Stage 1 includes: ? Early labor. This is when contractions may be irregular, or regular and mild. Generally, early labor contractions are more than 10 minutes apart. ? Active labor. This is when contractions get longer, more regular, more frequent, and more intense. ? The transition phase. This is when contractions happen very close together, are very intense, and may last longer than during any other part of labor.  Contractions generally feel mild, infrequent, and irregular at first. They get stronger, more frequent (about every 2-3 minutes), and more regular as you progress from early labor through active labor and transition.  Many women progress through stage 1 naturally, but you may need help to continue making progress. If this happens, your health care provider may talk with you about: ? Rupturing your amniotic sac if it has not ruptured yet. ? Giving you medicine to help make your contractions stronger and more frequent.  Stage 1 ends when your cervix is completely dilated to 4 inches (10 cm) and completely effaced. This happens at the end of the transition phase. Stage 2  Once   your cervix is completely effaced and dilated to 4 inches (10 cm), you may start to feel an urge to push. It is common for the body to naturally take a rest before feeling the urge to push, especially if you received an epidural or certain other pain medicines. This rest period may last for up to 1-2 hours, depending on your unique labor experience.  During stage 2, contractions are generally less painful, because pushing helps relieve contraction pain. Instead of contraction pain, you may feel stretching and burning pain, especially when the widest part of your baby's head passes through the vaginal opening (crowning).  Your health care provider will closely monitor your pushing progress and your baby's progress through the vagina during stage 2.  Your  health care provider may massage the area of skin between your vaginal opening and anus (perineum) or apply warm compresses to your perineum. This helps it stretch as the baby's head starts to crown, which can help prevent perineal tearing. ? In some cases, an incision may be made in your perineum (episiotomy) to allow the baby to pass through the vaginal opening. An episiotomy helps to make the opening of the vagina larger to allow more room for the baby to fit through.  It is very important to breathe and focus so your health care provider can control the delivery of your baby's head. Your health care provider may have you decrease the intensity of your pushing, to help prevent perineal tearing.  After delivery of your baby's head, the shoulders and the rest of the body generally deliver very quickly and without difficulty.  Once your baby is delivered, the umbilical cord may be cut right away, or this may be delayed for 1-2 minutes, depending on your baby's health. This may vary among health care providers, hospitals, and birth centers.  If you and your baby are healthy enough, your baby may be placed on your chest or abdomen to help maintain the baby's temperature and to help you bond with each other. Some mothers and babies start breastfeeding at this time. Your health care team will dry your baby and help keep your baby warm during this time.  Your baby may need immediate care if he or she: ? Showed signs of distress during labor. ? Has a medical condition. ? Was born too early (prematurely). ? Had a bowel movement before birth (meconium). ? Shows signs of difficulty transitioning from being inside the uterus to being outside of the uterus. If you are planning to breastfeed, your health care team will help you begin a feeding. Stage 3  The third stage of labor starts immediately after the birth of your baby and ends after you deliver the placenta. The placenta is an organ that develops  during pregnancy to provide oxygen and nutrients to your baby in the womb.  Delivering the placenta may require some pushing, and you may have mild contractions. Breastfeeding can stimulate contractions to help you deliver the placenta.  After the placenta is delivered, your uterus should tighten (contract) and become firm. This helps to stop bleeding in your uterus. To help your uterus contract and to control bleeding, your health care provider may: ? Give you medicine by injection, through an IV tube, by mouth, or through your rectum (rectally). ? Massage your abdomen or perform a vaginal exam to remove any blood clots that are left in your uterus. ? Empty your bladder by placing a thin, flexible tube (catheter) into your bladder. ? Encourage   you to breastfeed your baby. After labor is over, you and your baby will be monitored closely to ensure that you are both healthy until you are ready to go home. Your health care team will teach you how to care for yourself and your baby. This information is not intended to replace advice given to you by your health care provider. Make sure you discuss any questions you have with your health care provider. Document Released: 05/20/2008 Document Revised: 02/29/2016 Document Reviewed: 08/26/2015 Elsevier Interactive Patient Education  2018 [employer]  

## 2018-02-18 NOTE — MAU Note (Signed)
Pt states she was sent from the office for a BPP.

## 2018-02-18 NOTE — Progress Notes (Signed)
I have reviewed the chart and agree with nursing staff's documentation of this patient's encounter.  NST: no accels, no decels, minimal variability, Cat. 2 tracing. No contractions on Mike Giptoco.   Shadonna Benedick A Charma Mocarski, CNM 02/18/2018 4:50 PM
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
## 2018-02-19 DIAGNOSIS — O288 Other abnormal findings on antenatal screening of mother: Secondary | ICD-10-CM | POA: Insufficient documentation

## 2018-02-19 DIAGNOSIS — Z3A36 36 weeks gestation of pregnancy: Secondary | ICD-10-CM | POA: Insufficient documentation

## 2018-02-19 DIAGNOSIS — O4103X0 Oligohydramnios, third trimester, not applicable or unspecified: Secondary | ICD-10-CM | POA: Insufficient documentation

## 2018-02-19 DIAGNOSIS — O133 Gestational [pregnancy-induced] hypertension without significant proteinuria, third trimester: Secondary | ICD-10-CM | POA: Insufficient documentation

## 2018-02-19 NOTE — Addendum Note (Signed)
Encounter addended by: Durwin Noraenney, Jeffrey M, MD on: 02/19/2018 10:08 AM  Actions taken: Problem List modified, Charge Capture section accepted, Problem List reviewed

## 2018-02-22 ENCOUNTER — Other Ambulatory Visit: Payer: Self-pay | Admitting: Advanced Practice Midwife

## 2018-02-24 ENCOUNTER — Encounter (HOSPITAL_COMMUNITY): Payer: Self-pay

## 2018-02-24 ENCOUNTER — Inpatient Hospital Stay (HOSPITAL_COMMUNITY)
Admission: AD | Admit: 2018-02-24 | Discharge: 2018-02-26 | DRG: 807 | Disposition: A | Discharge status: Home / Self Care | Payer: BLUE CROSS/BLUE SHIELD | Attending: Obstetrics & Gynecology | Admitting: Obstetrics & Gynecology

## 2018-02-24 ENCOUNTER — Inpatient Hospital Stay (HOSPITAL_COMMUNITY): Payer: BLUE CROSS/BLUE SHIELD | Admitting: Anesthesiology

## 2018-02-24 DIAGNOSIS — O9902 Anemia complicating childbirth: Secondary | ICD-10-CM | POA: Diagnosis present

## 2018-02-24 DIAGNOSIS — D649 Anemia, unspecified: Secondary | ICD-10-CM | POA: Diagnosis present

## 2018-02-24 DIAGNOSIS — Z87891 Personal history of nicotine dependence: Secondary | ICD-10-CM | POA: Diagnosis not present

## 2018-02-24 DIAGNOSIS — E669 Obesity, unspecified: Secondary | ICD-10-CM | POA: Diagnosis present

## 2018-02-24 DIAGNOSIS — O134 Gestational [pregnancy-induced] hypertension without significant proteinuria, complicating childbirth: Principal | ICD-10-CM | POA: Diagnosis present

## 2018-02-24 DIAGNOSIS — O09299 Supervision of pregnancy with other poor reproductive or obstetric history, unspecified trimester: Secondary | ICD-10-CM

## 2018-02-24 DIAGNOSIS — Z3A37 37 weeks gestation of pregnancy: Secondary | ICD-10-CM

## 2018-02-24 DIAGNOSIS — O133 Gestational [pregnancy-induced] hypertension without significant proteinuria, third trimester: Secondary | ICD-10-CM | POA: Diagnosis present

## 2018-02-24 DIAGNOSIS — J45909 Unspecified asthma, uncomplicated: Secondary | ICD-10-CM | POA: Diagnosis present

## 2018-02-24 DIAGNOSIS — O99214 Obesity complicating childbirth: Secondary | ICD-10-CM | POA: Diagnosis present

## 2018-02-24 DIAGNOSIS — O9952 Diseases of the respiratory system complicating childbirth: Secondary | ICD-10-CM | POA: Diagnosis present

## 2018-02-24 LAB — COMPREHENSIVE METABOLIC PANEL
ALBUMIN: 2.9 g/dL — AB (ref 3.5–5.0)
ALK PHOS: 113 U/L (ref 38–126)
ALT: 14 U/L (ref 0–44)
ANION GAP: 9 (ref 5–15)
AST: 26 U/L (ref 15–41)
BILIRUBIN TOTAL: 0.3 mg/dL (ref 0.3–1.2)
BUN: 5 mg/dL — ABNORMAL LOW (ref 6–20)
CALCIUM: 8.4 mg/dL — AB (ref 8.9–10.3)
CO2: 19 mmol/L — ABNORMAL LOW (ref 22–32)
Chloride: 106 mmol/L (ref 98–111)
Creatinine, Ser: 0.51 mg/dL (ref 0.44–1.00)
GFR calc Af Amer: >60 mL/min (ref >60)
GLUCOSE: 82 mg/dL (ref 70–99)
Potassium: 3.6 mmol/L (ref 3.5–5.1)
Sodium: 134 mmol/L — ABNORMAL LOW (ref 135–145)
TOTAL PROTEIN: 6.8 g/dL (ref 6.5–8.1)

## 2018-02-24 LAB — CBC
HCT: 32 % — ABNORMAL LOW (ref 36.0–46.0)
Hemoglobin: 10.6 g/dL — ABNORMAL LOW (ref 12.0–15.0)
MCH: 26.9 pg (ref 26.0–34.0)
MCHC: 33.1 g/dL (ref 30.0–36.0)
MCV: 81.2 fL (ref 78.0–100.0)
PLATELETS: 220 10*3/uL (ref 150–400)
RBC: 3.94 MIL/uL (ref 3.87–5.11)
RDW: 14.7 % (ref 11.5–15.5)
WBC: 7.4 10*3/uL (ref 4.0–10.5)

## 2018-02-24 LAB — RPR: RPR: NONREACTIVE

## 2018-02-24 LAB — PROTEIN / CREATININE RATIO, URINE
Creatinine, Urine: 33 mg/dL
Total Protein, Urine: <6 mg/dL

## 2018-02-24 LAB — TYPE AND SCREEN
ABO/RH(D): B POS
ANTIBODY SCREEN: NEGATIVE

## 2018-02-24 MED ORDER — LACTATED RINGERS IV SOLN: 500.0000 mL | Freq: Once | INTRAVENOUS | Status: DC

## 2018-02-24 MED ORDER — MISOPROSTOL 50MCG HALF TABLET: 50.0000 ug | ORAL_TABLET | ORAL | Status: DC | PRN

## 2018-02-24 MED ORDER — OXYTOCIN BOLUS FROM INFUSION
500.0000 mL | Freq: Once | INTRAVENOUS | Status: AC
  Administered 2018-02-24: 500 mL via INTRAVENOUS

## 2018-02-24 MED ORDER — HYDROXYZINE HCL 25 MG PO TABS
25.0000 mg | ORAL_TABLET | Freq: Three times a day (TID) | ORAL | Status: DC | PRN
  Administered 2018-02-24: 25 mg via ORAL
  Filled 2018-02-24 (×2): qty 1

## 2018-02-24 MED ORDER — SOD CITRATE-CITRIC ACID 500-334 MG/5ML PO SOLN: 30.0000 mL | ORAL | Status: DC | PRN

## 2018-02-24 MED ORDER — FENTANYL CITRATE (PF) 100 MCG/2ML IJ SOLN
100.0000 ug | INTRAMUSCULAR | Status: DC | PRN
  Administered 2018-02-24 (×2): 100 ug via INTRAVENOUS
  Filled 2018-02-24 (×2): qty 2

## 2018-02-24 MED ORDER — OXYCODONE-ACETAMINOPHEN 5-325 MG PO TABS: 2.0000 | ORAL_TABLET | ORAL | Status: DC | PRN

## 2018-02-24 MED ORDER — PHENYLEPHRINE 40 MCG/ML (10ML) SYRINGE FOR IV PUSH (FOR BLOOD PRESSURE SUPPORT)
80.0000 ug | PREFILLED_SYRINGE | INTRAVENOUS | Status: DC | PRN
  Filled 2018-02-24: qty 10
  Filled 2018-02-24: qty 5

## 2018-02-24 MED ORDER — LACTATED RINGERS IV SOLN
INTRAVENOUS | Status: DC
  Administered 2018-02-24 (×2): via INTRAVENOUS

## 2018-02-24 MED ORDER — OXYCODONE-ACETAMINOPHEN 5-325 MG PO TABS: 1.0000 | ORAL_TABLET | ORAL | Status: DC | PRN

## 2018-02-24 MED ORDER — LIDOCAINE HCL (PF) 1 % IJ SOLN
INTRAMUSCULAR | Status: DC | PRN
  Administered 2018-02-24 (×2): 5 mL via EPIDURAL

## 2018-02-24 MED ORDER — EPHEDRINE 5 MG/ML INJ
10.0000 mg | INTRAVENOUS | Status: DC | PRN
  Filled 2018-02-24: qty 2

## 2018-02-24 MED ORDER — FENTANYL 2.5 MCG/ML BUPIVACAINE 1/10 % EPIDURAL INFUSION (WH - ANES)
14.0000 mL/h | INTRAMUSCULAR | Status: DC | PRN
  Administered 2018-02-24: 14 mL/h via EPIDURAL
  Filled 2018-02-24: qty 100

## 2018-02-24 MED ORDER — ACETAMINOPHEN 325 MG PO TABS: 650.0000 mg | ORAL_TABLET | ORAL | Status: DC | PRN

## 2018-02-24 MED ORDER — TERBUTALINE SULFATE 1 MG/ML IJ SOLN
0.2500 mg | Freq: Once | INTRAMUSCULAR | Status: DC | PRN
  Filled 2018-02-24: qty 1

## 2018-02-24 MED ORDER — LIDOCAINE HCL (PF) 1 % IJ SOLN
30.0000 mL | INTRAMUSCULAR | Status: DC | PRN
  Filled 2018-02-24: qty 30

## 2018-02-24 MED ORDER — DIPHENHYDRAMINE HCL 50 MG/ML IJ SOLN
12.5000 mg | INTRAMUSCULAR | Status: DC | PRN
  Administered 2018-02-24 (×2): 12.5 mg via INTRAVENOUS
  Filled 2018-02-24: qty 1

## 2018-02-24 MED ORDER — NALBUPHINE HCL 10 MG/ML IJ SOLN
10.0000 mg | INTRAMUSCULAR | Status: DC | PRN
  Filled 2018-02-24: qty 1

## 2018-02-24 MED ORDER — LACTATED RINGERS IV SOLN: 500.0000 mL | INTRAVENOUS | Status: DC | PRN

## 2018-02-24 MED ORDER — OXYTOCIN 40 UNITS IN LACTATED RINGERS INFUSION - SIMPLE MED
2.5000 [IU]/h | INTRAVENOUS | Status: DC
  Administered 2018-02-24: 2.5 [IU]/h via INTRAVENOUS
  Filled 2018-02-24: qty 1000

## 2018-02-24 MED ORDER — ONDANSETRON HCL 4 MG/2ML IJ SOLN
4.0000 mg | Freq: Four times a day (QID) | INTRAMUSCULAR | Status: DC | PRN
Start: 2018-02-24 — End: 2018-02-25

## 2018-02-24 MED ORDER — OXYTOCIN 40 UNITS IN LACTATED RINGERS INFUSION - SIMPLE MED
1.0000 m[IU]/min | INTRAVENOUS | Status: DC
  Administered 2018-02-24: 2 m[IU]/min via INTRAVENOUS

## 2018-02-24 MED ORDER — PHENYLEPHRINE 40 MCG/ML (10ML) SYRINGE FOR IV PUSH (FOR BLOOD PRESSURE SUPPORT)
80.0000 ug | PREFILLED_SYRINGE | INTRAVENOUS | Status: DC | PRN
  Filled 2018-02-24: qty 5

## 2018-02-24 NOTE — Progress Notes (Signed)
Labor Progress Note  Sydney Henry is a 27 y.o. [phone] at 6239w0d  admitted for induction of labor due to gestational Hypertension and history of pre-e in last pregancy.  S: She is doing well and not in pain s/p epidural. Denies PIH symptoms.    O:  BP 124/72   Pulse 89   Temp 97.6 F (36.4 C) (Oral)   Resp 18   LMP 06/10/2017   SpO2 100%   No intake/output data recorded.  FHT:  FHR: 130 bpm, variability: moderate,  accelerations:  Present,  decelerations:  Absent UC:   2-3 minutes SVE:   Dilation: 3 Effacement (%): 60 Station: -1 Exam by:: Foye ClockS. Oklesh RN ROM: AROM at 1420 Clear fluid  Pitocin @ 18 mu/min  Labs: Lab Results  Component Value Date   WBC 7.4 02/24/2018   HGB 10.6 (L) 02/24/2018   HCT 32.0 (L) 02/24/2018   MCV 81.2 02/24/2018   PLT 220 02/24/2018    Assessment / Plan: 27 y.o. [id number] 2639w0d in early labor Induction of labor due to gestational hypertension,  progressing well on pitocin  Labor: Progressing on Pitocin, s/p AROM. Good contraction pattern. Fetal Wellbeing:  Category I Pain Control:  Epidural Anticipated MOD:  NSVD  Expectant management  Caryl AdaJazma Phelps, DO OB Fellow [facility]

## 2018-02-24 NOTE — Anesthesia Procedure Notes (Signed)
Epidural Patient location during procedure: OB Start time: 02/24/2018 4:21 PM End time: 02/24/2018 4:38 PM  Staffing Anesthesiologist: Heather RobertsSinger, Mylena Sedberry, MD Performed: anesthesiologist   Preanesthetic Checklist Completed: patient identified, site marked, pre-op evaluation, timeout performed, IV checked, risks and benefits discussed and monitors and equipment checked  Epidural Patient position: sitting Prep: DuraPrep Patient monitoring: heart rate, cardiac monitor, continuous pulse ox and blood pressure Approach: midline Location: L2-L3 Injection technique: LOR saline  Needle:  Needle type: Tuohy  Needle gauge: 17 G Needle length: 9 cm Needle insertion depth: 6 cm Catheter size: 20 Guage Catheter at skin depth: 11 cm Test dose: negative and Other  Assessment Events: blood not aspirated, injection not painful, no injection resistance and negative IV test  Additional Notes Informed consent obtained prior to proceeding including risk of failure, 1% risk of PDPH, risk of minor discomfort and bruising.  Discussed rare but serious complications including epidural abscess, permanent nerve injury, epidural hematoma.  Discussed alternatives to epidural analgesia and patient desires to proceed.  Timeout performed pre-procedure verifying patient name, procedure, and platelet count.  Patient tolerated procedure well.
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
## 2018-02-24 NOTE — Anesthesia Preprocedure Evaluation (Signed)
Anesthesia Evaluation  Patient identified by MRN, date of birth, ID band Patient awake    Reviewed: Allergy & Precautions, H&P , NPO status , Patient's Chart, lab work & pertinent test results  History of Anesthesia Complications Negative for: history of anesthetic complications  Airway Mallampati: II  TM Distance: >3 FB Neck ROM: full    Dental no notable dental hx. (+) Dental Advisory Given   Pulmonary neg pulmonary ROS, former smoker,    Pulmonary exam normal        Cardiovascular hypertension, Normal cardiovascular exam     Neuro/Psych negative neurological ROS  negative psych ROS   GI/Hepatic negative GI ROS, Neg liver ROS,   Endo/Other  negative endocrine ROS  Renal/GU negative Renal ROS  negative genitourinary   Musculoskeletal negative musculoskeletal ROS (+)   Abdominal Normal abdominal exam  (+)   Peds negative pediatric ROS (+)  Hematology negative hematology ROS (+)   Anesthesia Other Findings   Reproductive/Obstetrics (+) Pregnancy                             Anesthesia Physical  Anesthesia Plan  ASA: II  Anesthesia Plan: Epidural   Post-op Pain Management:    Induction:   PONV Risk Score and Plan:   Airway Management Planned: Natural Airway  Additional Equipment:   Intra-op Plan:   Post-operative Plan:   Informed Consent: I have reviewed the patients History and Physical, chart, labs and discussed the procedure including the risks, benefits and alternatives for the proposed anesthesia with the patient or authorized representative who has indicated his/her understanding and acceptance.   Dental advisory given  Plan Discussed with: Anesthesiologist  Anesthesia Plan Comments:         Anesthesia Quick Evaluation

## 2018-02-24 NOTE — Progress Notes (Signed)
Labor Progress Note Sydney Henry is a 27 y.o. [phone] at 3464w0d presented for IOL for gHTN.  S: Patient doing well, denies pain but feeling some pressure. Denies headache, blurry vision, RUQ pain.  O:  BP 128/89   Pulse 93   Temp (!) 97.3 F (36.3 C) (Axillary)   Resp 18   LMP 06/10/2017   SpO2 100%   JYN:WGNFAOZHFHT:baseline rate 145, moderate variability, + acels, no decels Toco: ctx every 3-4 min  CVE: Dilation: 7.5 Effacement (%): 90 Station: 0 Presentation: Vertex Exam by:: Gwendolyn GrantMadison Hicks, RN   A&P: 27 y.o. [id number] 4664w0d here for IOL for gHTN. Labor: Progressing well. Currently on 20 of Pitocin, titrate as needed. Pain: Epidural FWB: Cat I GBS negative  GHTN: BP intermittently elevated. Currently asymptomatic. Will continue to monitor.  Ellwood DenseAlison Rumball, DO 9:58 PM

## 2018-02-24 NOTE — Progress Notes (Signed)
Labor Progress Note  Sydney Henry D Fanfan is a 27 y.o. [phone] at 6687w0d  admitted for induction of labor due to gestational Hypertension and history of pre-e in last pregancy.  S: She is doing well and in minimal pain, states she does not need pain medication currently. Feeling contractions more frequently. Denies HA, N/V, or RUQ.    O:  BP (!) 134/91   Pulse 88   Temp 98 F (36.7 C) (Oral)   Resp 18   LMP 06/10/2017   No intake/output data recorded.  FHT:  FHR: 130 bpm, variability: moderate,  accelerations:  Present,  decelerations:  Absent UC:   2-5 minutes SVE:   Dilation: 2.5 Effacement (%): 60 Station: -2 Exam by:: [employer] ROM: AROM at 1420 Clear fluid  Pitocin @ 14 mu/min  Labs: Lab Results  Component Value Date   WBC 7.4 02/24/2018   HGB 10.6 (L) 02/24/2018   HCT 32.0 (L) 02/24/2018   MCV 81.2 02/24/2018   PLT 220 02/24/2018    Assessment / Plan: 27 y.o. [id number] 6087w0d in early labor Induction of labor due to gestational hypertension,  progressing well on pitocin  Labor: Progressing on Pitocin, AROM at 1420 to progress labor further Fetal Wellbeing:  Category I Pain Control:  Labor support without medications Anticipated MOD:  NSVD  Expectant management   [place] PinkLexi Dione Petron, MS3

## 2018-02-25 ENCOUNTER — Encounter (HOSPITAL_COMMUNITY): Payer: Self-pay

## 2018-02-25 MED ORDER — SENNOSIDES-DOCUSATE SODIUM 8.6-50 MG PO TABS
2.0000 | ORAL_TABLET | ORAL | Status: DC
  Administered 2018-02-25: 2 via ORAL
  Filled 2018-02-25: qty 2

## 2018-02-25 MED ORDER — BENZOCAINE-MENTHOL 20-0.5 % EX AERO
1.0000 "application " | INHALATION_SPRAY | CUTANEOUS | Status: DC | PRN
  Administered 2018-02-25: 1 via TOPICAL
  Filled 2018-02-25: qty 56

## 2018-02-25 MED ORDER — TETANUS-DIPHTH-ACELL PERTUSSIS 5-2.5-18.5 LF-MCG/0.5 IM SUSP: 0.5000 mL | Freq: Once | INTRAMUSCULAR | Status: DC

## 2018-02-25 MED ORDER — ONDANSETRON HCL 4 MG/2ML IJ SOLN: 4.0000 mg | INTRAMUSCULAR | Status: DC | PRN

## 2018-02-25 MED ORDER — IBUPROFEN 600 MG PO TABS
600.0000 mg | ORAL_TABLET | Freq: Four times a day (QID) | ORAL | Status: DC
  Administered 2018-02-25 – 2018-02-26 (×7): 600 mg via ORAL
  Filled 2018-02-25 (×7): qty 1

## 2018-02-25 MED ORDER — ACETAMINOPHEN 325 MG PO TABS: 650.0000 mg | ORAL_TABLET | ORAL | Status: DC | PRN

## 2018-02-25 MED ORDER — PRENATAL MULTIVITAMIN CH
1.0000 | ORAL_TABLET | Freq: Every day | ORAL | Status: DC
  Administered 2018-02-25 – 2018-02-26 (×2): 1 via ORAL
  Filled 2018-02-25 (×2): qty 1

## 2018-02-25 MED ORDER — SIMETHICONE 80 MG PO CHEW: 80.0000 mg | CHEWABLE_TABLET | ORAL | Status: DC | PRN

## 2018-02-25 MED ORDER — DIPHENHYDRAMINE HCL 25 MG PO CAPS: 25.0000 mg | ORAL_CAPSULE | Freq: Four times a day (QID) | ORAL | Status: DC | PRN

## 2018-02-25 MED ORDER — WITCH HAZEL-GLYCERIN EX PADS: 1.0000 "application " | MEDICATED_PAD | CUTANEOUS | Status: DC | PRN

## 2018-02-25 MED ORDER — ONDANSETRON HCL 4 MG PO TABS: 4.0000 mg | ORAL_TABLET | ORAL | Status: DC | PRN

## 2018-02-25 MED ORDER — ZOLPIDEM TARTRATE 5 MG PO TABS: 5.0000 mg | ORAL_TABLET | Freq: Every evening | ORAL | Status: DC | PRN

## 2018-02-25 MED ORDER — COCONUT OIL OIL: 1.0000 "application " | TOPICAL_OIL | Status: DC | PRN

## 2018-02-25 MED ORDER — DIBUCAINE 1 % RE OINT: 1.0000 "application " | TOPICAL_OINTMENT | RECTAL | Status: DC | PRN

## 2018-02-25 NOTE — Anesthesia Postprocedure Evaluation (Signed)
Anesthesia Post Note  Patient: Sydney Henry  Procedure(s) Performed: AN AD HOC LABOR EPIDURAL     Patient location during evaluation: Mother Baby Anesthesia Type: Epidural Level of consciousness: awake and alert Pain management: pain level controlled Vital Signs Assessment: post-procedure vital signs reviewed and stable Respiratory status: spontaneous breathing, nonlabored ventilation and respiratory function stable Cardiovascular status: stable Postop Assessment: no headache, no backache, epidural receding, able to ambulate, adequate PO intake, no apparent nausea or vomiting and patient able to bend at knees Anesthetic complications: no    Last Vitals:  Vitals:   02/25/18 0135 02/25/18 0550  BP: (!) 141/95 125/83  Pulse: 99 99  Resp: 18 18  Temp: 37.1 C 37.2 C  SpO2:      Last Pain:  Vitals:   02/25/18 0550  TempSrc: Oral  PainSc: 2    Pain Goal: Patients Stated Pain Goal: 2 (02/25/18 0550)               Laban EmperorMalinova,Heiley Shaikh Hristova

## 2018-02-25 NOTE — Progress Notes (Signed)
POSTPARTUM PROGRESS NOTE  Post Partum Day 1 Subjective:  Sydney Henry is a 27 y.o. [id number] 6134w0d s/p SVD.  No acute events overnight.  Pt denies problems with ambulating, voiding or po intake.  She denies nausea or vomiting.  Pain is well controlled. Lochia Moderate.   Objective: Blood pressure 125/83, pulse 99, temperature 98.9 F (37.2 C), temperature source Oral, resp. rate 18, last menstrual period 06/10/2017, SpO2 100 %, unknown if currently breastfeeding.  Physical Exam:  General: alert, cooperative and no distress Lochia:normal flow Chest: no respiratory distress Heart:regular rate, distal pulses intact Abdomen: soft, nontender,  Uterine Fundus: firm, appropriately tender DVT Evaluation: No calf swelling or tenderness Extremities: no edema  Recent Labs    02/24/18 0752  HGB 10.6*  HCT 32.0*    Assessment/Plan:  ASSESSMENT: Sydney Henry is a 27 y.o. [id number] 8234w0d s/p SVD, doing well. Did have some elevated BP immediately postpartum but normalized overnight without intervention, will continue to monitor. Currently without PIH symptoms.  Bottle feeding Plans for interval BTL. Plan for discharge tomorrow.   LOS: 1 day   Kingsley Spittlelison RumballDO 02/25/2018, 7:04 AM

## 2018-02-26 MED ORDER — ENALAPRIL MALEATE 2.5 MG PO TABS: 2.5000 mg | ORAL_TABLET | Freq: Every day | ORAL | 0 refills | Status: DC

## 2018-02-26 MED ORDER — IBUPROFEN 600 MG PO TABS: 600.0000 mg | ORAL_TABLET | Freq: Four times a day (QID) | ORAL | 0 refills | Status: DC

## 2018-02-26 MED ORDER — SUCROSE 24% NICU/PEDS ORAL SOLUTION: 0.5000 mL | OROMUCOSAL | Status: DC | PRN

## 2018-02-26 MED ORDER — LIDOCAINE 1% INJECTION FOR CIRCUMCISION
0.8000 mL | INJECTION | Freq: Once | INTRAVENOUS | Status: DC
  Filled 2018-02-26: qty 1

## 2018-02-26 MED ORDER — ENALAPRIL MALEATE 2.5 MG PO TABS
2.5000 mg | ORAL_TABLET | Freq: Every day | ORAL | Status: DC
  Administered 2018-02-26: 2.5 mg via ORAL
  Filled 2018-02-26 (×2): qty 1

## 2018-02-26 MED ORDER — EPINEPHRINE TOPICAL FOR CIRCUMCISION 0.1 MG/ML: 1.0000 [drp] | TOPICAL | Status: DC | PRN

## 2018-02-26 MED ORDER — ACETAMINOPHEN FOR CIRCUMCISION 160 MG/5 ML: 40.0000 mg | ORAL | Status: DC | PRN

## 2018-02-26 MED ORDER — ACETAMINOPHEN FOR CIRCUMCISION 160 MG/5 ML: 40.0000 mg | Freq: Once | ORAL | Status: DC

## 2018-02-26 MED FILL — ENALAPRIL MALEATE 2.5 MG TA: 2.5 | 30 days supply | Qty: 30 | Fill #0

## 2018-02-26 NOTE — Discharge Instructions (Signed)
Vaginal Delivery, Care After °Refer to this sheet in the next few weeks. These instructions provide you with information about caring for yourself after vaginal delivery. Your health care provider may also give you more specific instructions. Your treatment has been planned according to current medical practices, but problems sometimes occur. Call your health care provider if you have any problems or questions. °What can I expect after the procedure? °After vaginal delivery, it is common to have: °· Some bleeding from your vagina. °· Soreness in your abdomen, your vagina, and the area of skin between your vaginal opening and your anus (perineum). °· Pelvic cramps. °· Fatigue. ° °Follow these instructions at home: °Medicines °· Take over-the-counter and prescription medicines only as told by your health care provider. °· If you were prescribed an antibiotic medicine, take it as told by your health care provider. Do not stop taking the antibiotic until it is finished. °Driving ° °· Do not drive or operate heavy machinery while taking prescription pain medicine. °· Do not drive for 24 hours if you received a sedative. °Lifestyle °· Do not drink alcohol. This is especially important if you are breastfeeding or taking medicine to relieve pain. °· Do not use tobacco products, including cigarettes, chewing tobacco, or e-cigarettes. If you need help quitting, ask your health care provider. °Eating and drinking °· Drink at least 8 eight-ounce glasses of water every day unless you are told not to by your health care provider. If you choose to breastfeed your baby, you may need to drink more water than this. °· Eat high-fiber foods every day. These foods may help prevent or relieve constipation. High-fiber foods include: °? Whole grain cereals and breads. °? Brown rice. °? Beans. °? Fresh fruits and vegetables. °Activity °· Return to your normal activities as told by your health care provider. Ask your health care provider  what activities are safe for you. °· Rest as much as possible. Try to rest or take a nap when your baby is sleeping. °· Do not lift anything that is heavier than your baby or 10 lb (4.5 kg) until your health care provider says that it is safe. °· Talk with your health care provider about when you can engage in sexual activity. This may depend on your: °? Risk of infection. °? Rate of healing. °? Comfort and desire to engage in sexual activity. °Vaginal Care °· If you have an episiotomy or a vaginal tear, check the area every day for signs of infection. Check for: °? More redness, swelling, or pain. °? More fluid or blood. °? Warmth. °? Pus or a bad smell. °· Do not use tampons or douches until your health care provider says this is safe. °· Watch for any blood clots that may pass from your vagina. These may look like clumps of dark red, brown, or black discharge. °General instructions °· Keep your perineum clean and dry as told by your health care provider. °· Wear loose, comfortable clothing. °· Wipe from front to back when you use the toilet. °· Ask your health care provider if you can shower or take a bath. If you had an episiotomy or a perineal tear during labor and delivery, your health care provider may tell you not to take baths for a certain length of time. °· Wear a bra that supports your breasts and fits you well. °· If possible, have someone help you with household activities and help care for your baby for at least a few days after   you leave the hospital. °· Keep all follow-up visits for you and your baby as told by your health care provider. This is important. °Contact a health care provider if: °· You have: °? Vaginal discharge that has a bad smell. °? Difficulty urinating. °? Pain when urinating. °? A sudden increase or decrease in the frequency of your bowel movements. °? More redness, swelling, or pain around your episiotomy or vaginal tear. °? More fluid or blood coming from your episiotomy or  vaginal tear. °? Pus or a bad smell coming from your episiotomy or vaginal tear. °? A fever. °? A rash. °? Little or no interest in activities you used to enjoy. °? Questions about caring for yourself or your baby. °· Your episiotomy or vaginal tear feels warm to the touch. °· Your episiotomy or vaginal tear is separating or does not appear to be healing. °· Your breasts are painful, hard, or turn red. °· You feel unusually sad or worried. °· You feel nauseous or you vomit. °· You pass large blood clots from your vagina. If you pass a blood clot from your vagina, save it to show to your health care provider. Do not flush blood clots down the toilet without having your health care provider look at them. °· You urinate more than usual. °· You are dizzy or light-headed. °· You have not breastfed at all and you have not had a menstrual period for 12 weeks after delivery. °· You have stopped breastfeeding and you have not had a menstrual period for 12 weeks after you stopped breastfeeding. °Get help right away if: °· You have: °? Pain that does not go away or does not get better with medicine. °? Chest pain. °? Difficulty breathing. °? Blurred vision or spots in your vision. °? Thoughts about hurting yourself or your baby. °· You develop pain in your abdomen or in one of your legs. °· You develop a severe headache. °· You faint. °· You bleed from your vagina so much that you fill two sanitary pads in one hour. °This information is not intended to replace advice given to you by your health care provider. Make sure you discuss any questions you have with your health care provider. °Document Released: 08/08/2000 Document Revised: 01/23/2016 Document Reviewed: 08/26/2015 °Elsevier Interactive Patient Education © 2018 [employer] ° °

## 2018-02-26 NOTE — Discharge Summary (Addendum)
OB Discharge Summary    Patient Name: Sydney Henry DOB: 1991-01-29 MRN: [id number] Date of admission: 02/24/2018  Delivering MD: Rory Percy )  Date of discharge: 02/26/2018  Admitting diagnosis: IOL for gHTN Intrauterine pregnancy: [redacted]w[redacted]d   Secondary diagnosis:  Active Problems:   Patient Active Problem List   Diagnosis Date Noted  . Gestational hypertension, third trimester   . Decreased amniotic fluid, third trimester, not applicable or unspecified fetus   . [redacted] weeks gestation of pregnancy   . NST (non-stress test) nonreactive   . Obesity in pregnancy 02/15/2018  . BMI 30s 02/15/2018  . Trichomonal vaginitis during pregnancy in third trimester 02/12/2018  . Preterm uterine contractions in third trimester, antepartum 02/09/2018  . Anemia in pregnancy 02/03/2018  . Supervision of other normal pregnancy, antepartum 08/31/2017  . Hx of preeclampsia, prior pregnancy, currently pregnant 08/31/2017  . Elevated blood pressure reading 11/12/2016  . ASCUS with positive high risk HPV cervical 11/03/2016  . Hypertension in pregnancy, antepartum 10/22/2011   Additional problems: +Trichomonas during pregnancy with negative TOC 02/18/18     Discharge diagnosis: Term Pregnancy Delivered and Gestational Hypertension                                                                                                Post partum procedures: None  Complications: None  Hospital course:  Induction of Labor With Vaginal Delivery   27y.o. yo [phone] admitted to the hospital 02/24/2018 for induction of labor.  Indication for induction: Gestational hypertension.  Patient had an uncomplicated labor course as follows: Membrane Rupture Time/Date: 2:10 PM ,02/24/2018   Intrapartum Procedures: Episiotomy: None [1]                                         Lacerations:  Periurethral [8]  Patient had delivery of a Viable infant.  Information for the patient's newborn:  HaChazlyn, Cude0[[id number]]     02/24/2018  Details of delivery can be found in separate delivery note.  Patient had a postpartum course notable for intermittently high BP with SBP 140s and was subsequently started on Vasotec. Patient is discharged home 02/26/18.  Physical exam  Vitals:   02/25/18 2123 02/26/18 0604  BP: (!) 143/92 (!) 129/91  Pulse: 99 87  Resp:  18  Temp: 97.8 F (36.6 C) 97.6 F (36.4 C)  SpO2: 100% 100%    General: alert, cooperative and no distress Lochia: appropriate Uterine Fundus: firm Incision: N/A DVT Evaluation: No evidence of DVT seen on physical exam.  Labs: No results found for this or any previous visit (from the past 24 hour(s)).   Discharge instruction: per After Visit Summary and "Baby and Me Booklet".  After visit meds:  No Known Allergies  Allergies as of 02/26/2018   No Known Allergies     Medication List    STOP taking these medications   cyclobenzaprine 10 MG tablet Commonly known as:  FLEXERIL   metroNIDAZOLE 500  MG tablet Commonly known as:  FLAGYL   promethazine 50 MG tablet Commonly known as:  PHENERGAN     TAKE these medications   albuterol 108 (90 Base) MCG/ACT inhaler Commonly known as:  PROVENTIL HFA;VENTOLIN HFA Inhale 2 puffs into the lungs every 6 (six) hours as needed for wheezing or shortness of breath.   aspirin EC 81 MG tablet Take 1 tablet (81 mg total) by mouth daily. Take after 12 weeks for prevention of preeclampsia later in pregnancy   CITRANATAL BLOOM 90-1 MG Tabs Take 1 tablet by mouth daily.   enalapril 2.5 MG tablet Commonly known as:  VASOTEC Take 1 tablet (2.5 mg total) by mouth daily.   ibuprofen 600 MG tablet Commonly known as:  ADVIL,MOTRIN Take 1 tablet (600 mg total) by mouth every 6 (six) hours.   oxyCODONE-acetaminophen 5-325 MG tablet Commonly known as:  PERCOCET/ROXICET Take 1-2 tablets by mouth every 4 (four) hours as needed for severe pain. 5 day supply      Diet: routine diet  Activity: Advance  as tolerated. Pelvic rest for 6 weeks.   Outpatient follow up: 1 week for BP check, 4-6 weeks postpartum Future Appointments: No future appointments.  Follow up Appt: No follow-ups on file.  Postpartum contraception: Undecided, previously desired BTL, wants to discuss with partner  Newborn Data: APGAR (1 MIN): 7   APGAR (5 MINS): 9    Baby Feeding: Bottle Disposition:home with mother  Rory Percy, DO 02/26/2018   I have spoken with and examined this patient and agree with resident/PA-S/Med-S/SNM's note and plan of care. VSS, HRR&R, Resp unlabored, Legs neg.  Nigel Berthold, CNM 03/03/2018 2:03 PM

## 2018-03-10 ENCOUNTER — Encounter: Payer: Self-pay | Admitting: Obstetrics & Gynecology

## 2018-03-10 ENCOUNTER — Ambulatory Visit (INDEPENDENT_AMBULATORY_CARE_PROVIDER_SITE_OTHER): Payer: BLUE CROSS/BLUE SHIELD | Admitting: Obstetrics & Gynecology

## 2018-03-10 VITALS — BP 147/100 | HR 74 | Wt 207.0 lb

## 2018-03-10 DIAGNOSIS — Z3042 Encounter for surveillance of injectable contraceptive: Secondary | ICD-10-CM

## 2018-03-10 DIAGNOSIS — O135 Gestational [pregnancy-induced] hypertension without significant proteinuria, complicating the puerperium: Secondary | ICD-10-CM

## 2018-03-10 DIAGNOSIS — Z1389 Encounter for screening for other disorder: Secondary | ICD-10-CM

## 2018-03-10 DIAGNOSIS — Z30013 Encounter for initial prescription of injectable contraceptive: Secondary | ICD-10-CM

## 2018-03-10 MED ORDER — MEDROXYPROGESTERONE ACETATE 150 MG/ML IM SUSP
150.0000 mg | Freq: Once | INTRAMUSCULAR | Status: AC
  Administered 2018-03-10: 150 mg via INTRAMUSCULAR

## 2018-03-10 MED ORDER — MEDROXYPROGESTERONE ACETATE 150 MG/ML IM SUSP: 150.0000 mg | INTRAMUSCULAR | 3 refills | Status: DC

## 2018-03-10 MED ORDER — ENALAPRIL MALEATE 5 MG PO TABS: 5.0000 mg | ORAL_TABLET | Freq: Every day | ORAL | 2 refills | Status: DC

## 2018-03-10 MED ORDER — MEDROXYPROGESTERONE ACETATE 150 MG/ML IM SUSP
150.0000 mg | Freq: Once | INTRAMUSCULAR | Status: DC
Start: 2018-03-10 — End: 2018-03-10

## 2018-03-10 NOTE — Progress Notes (Signed)
Pt was given office supply Depo at today's visit. Pt was made aware of Depo policy. Pt tolerated injection well. Pt has no other questions. Pt advised to RTO on 06/01/18 for next depo.  Administrations This Visit    medroxyPROGESTERone (DEPO-PROVERA) injection 150 mg    Admin Date 03/10/2018 Action Given Dose 150 mg Route Intramuscular Administered By Lanney GinsFoster, Marleta Lapierre D, CMA

## 2018-03-10 NOTE — Patient Instructions (Signed)
Return to clinic for any scheduled appointments or for any gynecologic concerns as needed.   

## 2018-03-10 NOTE — Progress Notes (Signed)
Post Partum Exam  Sydney Henry is a 27 y.o. [phone] female who presents for a postpartum visit. She is 2 weeks postpartum following a spontaneous vaginal delivery. I have fully reviewed the prenatal and intrapartum course. The delivery was at 37 gestational weeks.  Anesthesia: epidural. Postpartum course has been doing well . Baby's course has been doing well. Baby is feeding by gerber goodstart. Bleeding thin lochia. Bowel function is normal. Bladder function is normal. Patient is not sexually active. Contraception method is abstinence for now, desires Depo Provera.  Postpartum depression screening:negative , score 0.  The following portions of the patient's history were reviewed and updated as appropriate: allergies, current medications, past family history, past medical history, past social history, past surgical history and problem list. Last pap smear done 10/2016 and was Normal  Review of Systems Pertinent items noted in HPI and remainder of comprehensive ROS otherwise negative.    Objective:  Blood pressure (!) 147/100, pulse 74, weight 207 lb (93.9 kg), last menstrual period 06/10/2017, unknown if currently breastfeeding.  General:  alert and no distress   Breasts:  ideferred  Lungs: normal breath sounds and effort  Heart:  regular rate and rhythm  Abdomen: soft, non-tender; bowel sounds normal; no masses,  no organomegaly   Pelvic:  not evaluated   Assessment:   Postpartum exam remarkable for elevated BP; patient has GHTN  Plan:   1. Contraception: Depo-Provera injections started today. 2. Increased Enalapril from 2.5 mg to 5 mg, needs to return in one week for BP check 3. Follow up in: 4 weeks for postpartum check also or as needed.   Jaynie CollinsUGONNA  Jeret Goyer, MD, FACOG Obstetrician & Gynecologist, [facility], [facility]

## 2018-03-24 ENCOUNTER — Ambulatory Visit (INDEPENDENT_AMBULATORY_CARE_PROVIDER_SITE_OTHER): Payer: BLUE CROSS/BLUE SHIELD | Admitting: Obstetrics and Gynecology

## 2018-03-24 ENCOUNTER — Encounter: Payer: Self-pay | Admitting: Obstetrics and Gynecology

## 2018-03-24 DIAGNOSIS — Z1389 Encounter for screening for other disorder: Secondary | ICD-10-CM | POA: Diagnosis not present

## 2018-03-24 DIAGNOSIS — IMO0001 Reserved for inherently not codable concepts without codable children: Secondary | ICD-10-CM | POA: Insufficient documentation

## 2018-03-24 NOTE — Patient Instructions (Signed)

## 2018-03-24 NOTE — Progress Notes (Signed)
Post Partum Exam  Sydney Henry is a 27 y.o. [phone] female who presents for a postpartum visit. She is 4 weeks postpartum following a spontaneous vaginal delivery. I have fully reviewed the prenatal and intrapartum course. The delivery was at 37gestational weeks.  Anesthesia: epidural. Postpartum course has been complicated by GHTN. Controlled with Vasotec.  Baby's course has been unremarkable. Baby is feeding by [employer]-wayerber Gentle . Bleeding no bleeding. Bowel function is normal. Bladder function is normal. Patient is not sexually active. Contraception method is Depo-Provera injections. Postpartum depression screening:neg  The following portions of the patient's history were reviewed and updated as appropriate: allergies, current medications, past family history, past medical history, past social history and past surgical history. Last pap smear done 11/12/2016 and was Normal  Review of Systems Pertinent items noted in HPI and remainder of comprehensive ROS otherwise negative.    Objective:  Last menstrual period 06/10/2017, unknown if currently breastfeeding.  General:  alert   Breasts:  not examined  Lungs: clear to auscultation bilaterally  Heart:  regular rate and rhythm, S1, S2 normal, no murmur, click, rub or gallop  Abdomen: soft, non-tender; bowel sounds normal; no masses,  no organomegaly   Vulva:  not evaluated  Vagina: not evaluated  Cervix:  not evaluated  Corpus: not examined  Adnexa:  not evaluated  Rectal Exam: Not performed.        Assessment:    NL postpartum exam.    HTN  Plan:   1. Contraception: Depo-Provera injections 2. Return to nl ADL's. To complete current Rx of Vasotec and follow up with PCP for BP check and monitoring. 3. Follow up in: 1 yr or as needed.

## 2018-04-27 ENCOUNTER — Encounter: Payer: Self-pay | Admitting: *Deleted

## 2018-06-01 ENCOUNTER — Ambulatory Visit: Payer: BLUE CROSS/BLUE SHIELD

## 2018-06-02 ENCOUNTER — Telehealth: Payer: Self-pay | Admitting: Obstetrics & Gynecology

## 2018-06-02 NOTE — Telephone Encounter (Signed)
Called pt to rescheduled Depo appointment and she stated that she does not want to continue with Depo because she does not like how it makes her feel. She will CB to schedule appointment with doctor to discuss other Birth Control options.

## 2020-10-18 ENCOUNTER — Ambulatory Visit: Payer: BLUE CROSS/BLUE SHIELD

## 2020-11-01 ENCOUNTER — Inpatient Hospital Stay (HOSPITAL_COMMUNITY)
Admission: AD | Admit: 2020-11-01 | Discharge: 2020-11-01 | Disposition: A | Discharge status: Home / Self Care | Payer: Medicaid Other | Attending: Obstetrics and Gynecology | Admitting: Obstetrics and Gynecology

## 2020-11-01 ENCOUNTER — Encounter (HOSPITAL_COMMUNITY): Payer: Self-pay | Admitting: Obstetrics and Gynecology

## 2020-11-01 ENCOUNTER — Other Ambulatory Visit (HOSPITAL_COMMUNITY): Payer: Self-pay | Admitting: Advanced Practice Midwife

## 2020-11-01 ENCOUNTER — Inpatient Hospital Stay (HOSPITAL_COMMUNITY): Payer: Medicaid Other

## 2020-11-01 ENCOUNTER — Other Ambulatory Visit: Payer: Self-pay

## 2020-11-01 DIAGNOSIS — O468X1 Other antepartum hemorrhage, first trimester: Secondary | ICD-10-CM

## 2020-11-01 DIAGNOSIS — Z3A01 Less than 8 weeks gestation of pregnancy: Secondary | ICD-10-CM | POA: Insufficient documentation

## 2020-11-01 DIAGNOSIS — M545 Low back pain, unspecified: Secondary | ICD-10-CM | POA: Insufficient documentation

## 2020-11-01 DIAGNOSIS — O26899 Other specified pregnancy related conditions, unspecified trimester: Secondary | ICD-10-CM | POA: Diagnosis present

## 2020-11-01 DIAGNOSIS — A5901 Trichomonal vulvovaginitis: Secondary | ICD-10-CM | POA: Diagnosis not present

## 2020-11-01 DIAGNOSIS — O23599 Infection of other part of genital tract in pregnancy, unspecified trimester: Secondary | ICD-10-CM | POA: Diagnosis present

## 2020-11-01 DIAGNOSIS — O418X10 Other specified disorders of amniotic fluid and membranes, first trimester, not applicable or unspecified: Secondary | ICD-10-CM

## 2020-11-01 DIAGNOSIS — I10 Essential (primary) hypertension: Secondary | ICD-10-CM | POA: Clinically undetermined

## 2020-11-01 DIAGNOSIS — O26891 Other specified pregnancy related conditions, first trimester: Secondary | ICD-10-CM

## 2020-11-01 DIAGNOSIS — Z87891 Personal history of nicotine dependence: Secondary | ICD-10-CM | POA: Insufficient documentation

## 2020-11-01 DIAGNOSIS — O3680X0 Pregnancy with inconclusive fetal viability, not applicable or unspecified: Secondary | ICD-10-CM

## 2020-11-01 DIAGNOSIS — O98311 Other infections with a predominantly sexual mode of transmission complicating pregnancy, first trimester: Secondary | ICD-10-CM | POA: Insufficient documentation

## 2020-11-01 DIAGNOSIS — O208 Other hemorrhage in early pregnancy: Secondary | ICD-10-CM | POA: Insufficient documentation

## 2020-11-01 DIAGNOSIS — R102 Pelvic and perineal pain: Secondary | ICD-10-CM

## 2020-11-01 HISTORY — DX: Essential (primary) hypertension: I10

## 2020-11-01 LAB — URINALYSIS, ROUTINE W REFLEX MICROSCOPIC
Bilirubin Urine: NEGATIVE
Glucose, UA: NEGATIVE mg/dL
Hgb urine dipstick: NEGATIVE
Ketones, ur: NEGATIVE mg/dL
Nitrite: NEGATIVE
Protein, ur: NEGATIVE mg/dL
Specific Gravity, Urine: 1.021 (ref 1.005–1.030)
pH: 6 (ref 5.0–8.0)

## 2020-11-01 LAB — WET PREP, GENITAL
Sperm: NONE SEEN
Yeast Wet Prep HPF POC: NONE SEEN

## 2020-11-01 LAB — COMPREHENSIVE METABOLIC PANEL
ALT: 12 U/L (ref 0–44)
AST: 17 U/L (ref 15–41)
Albumin: 3.6 g/dL (ref 3.5–5.0)
Alkaline Phosphatase: 40 U/L (ref 38–126)
Anion gap: 7 (ref 5–15)
BUN: 7 mg/dL (ref 6–20)
CO2: 22 mmol/L (ref 22–32)
Calcium: 9.4 mg/dL (ref 8.9–10.3)
Chloride: 104 mmol/L (ref 98–111)
Creatinine, Ser: 0.61 mg/dL (ref 0.44–1.00)
GFR, Estimated: >60 mL/min (ref >60)
Glucose, Bld: 85 mg/dL (ref 70–99)
Potassium: 4.1 mmol/L (ref 3.5–5.1)
Sodium: 133 mmol/L — ABNORMAL LOW (ref 135–145)
Total Bilirubin: 0.4 mg/dL (ref 0.3–1.2)
Total Protein: 6.2 g/dL — ABNORMAL LOW (ref 6.5–8.1)

## 2020-11-01 LAB — CBC
HCT: 35.1 % — ABNORMAL LOW (ref 36.0–46.0)
Hemoglobin: 11.8 g/dL — ABNORMAL LOW (ref 12.0–15.0)
MCH: 30.9 pg (ref 26.0–34.0)
MCHC: 33.6 g/dL (ref 30.0–36.0)
MCV: 91.9 fL (ref 80.0–100.0)
Platelets: 190 10*3/uL (ref 150–400)
RBC: 3.82 MIL/uL — ABNORMAL LOW (ref 3.87–5.11)
RDW: 14.3 % (ref 11.5–15.5)
WBC: 5.9 10*3/uL (ref 4.0–10.5)
nRBC: 0 % (ref 0.0–0.2)

## 2020-11-01 LAB — POCT PREGNANCY, URINE: Preg Test, Ur: POSITIVE — AB

## 2020-11-01 LAB — GC/CHLAMYDIA PROBE AMP (~~LOC~~) NOT AT ARMC
Chlamydia: NEGATIVE
Comment: NEGATIVE
Comment: NORMAL
Neisseria Gonorrhea: NEGATIVE

## 2020-11-01 LAB — HCG, QUANTITATIVE, PREGNANCY: hCG, Beta Chain, Quant, S: 53375 m[IU]/mL — ABNORMAL HIGH (ref <5)

## 2020-11-01 MED ORDER — METRONIDAZOLE 500 MG PO TABS: 500.0000 mg | ORAL_TABLET | Freq: Two times a day (BID) | ORAL | 0 refills | Status: AC

## 2020-11-01 MED ORDER — HYDROMORPHONE HCL 1 MG/ML IJ SOLN
0.5000 mg | Freq: Once | INTRAMUSCULAR | Status: AC
  Administered 2020-11-01: 0.5 mg via INTRAMUSCULAR
  Filled 2020-11-01: qty 1

## 2020-11-01 MED ORDER — METRONIDAZOLE 500 MG PO TABS
500.0000 mg | ORAL_TABLET | Freq: Once | ORAL | Status: AC
  Administered 2020-11-01: 500 mg via ORAL
  Filled 2020-11-01: qty 1

## 2020-11-01 MED FILL — metroNIDAZOLE 500 MG TABS: 500 | 7 days supply | Qty: 14 | Fill #0

## 2020-11-01 NOTE — MAU Note (Signed)
For last couple days have been having lower abd and lower back pain. Have felt faint and dizzy several times last few days. Pain is sharp and crampy and intense. Some spotting last couple days.
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
## 2020-11-01 NOTE — Discharge Instructions (Signed)
[facility] [employer] [facility] [employer] [facility] [employer] [facility]      Phone: [phone]  [facility] [employer] at [place]    Phone: [phone]  [facility] [employer] at [place]   Phone: [phone]  [facility] [employer] at [employer]   Phone: [phone]  [facility] [employer] at [place]   Phone: [phone]  [facility] at [facility]    Phone: [phone]  [facility]        Phone: [phone]  [facility]     Phone: [phone]   [facility]     Phone: [phone]  [facility]     Phone: [phone]  [facility]     Phone: [phone]  [facility]        Phone: [phone]   [facility]   Phone: [phone]  [facility]     Phone: [phone]  [facility] [place]    Phone: [phone]  [facility]       Phone: [phone]  [facility]     Phone: [phone]   First Trimester of Pregnancy  The first trimester of pregnancy starts on the first day of your last menstrual period until the end of week 12. This is months 1 through 3 of pregnancy. A week after a sperm fertilizes an egg, the egg will implant into the wall of the uterus and begin to develop into a baby. By the end of 12 weeks, all the baby's organs will be formed and the baby will be 2-3 inches in size. Body changes during your first trimester Your body goes through many changes during pregnancy. The changes vary and generally return to normal after your baby is born. Physical changes  You may gain or lose weight.  Your breasts may begin to grow larger and become tender. The tissue that surrounds your nipples (areola) may become darker.  Dark spots or blotches (chloasma or mask  of pregnancy) may develop on your face.  You may have changes in your hair. These can include thickening or thinning of your hair or changes in texture. Health changes  You may feel nauseous, and you may vomit.  You may have heartburn.  You may develop headaches.  You may develop constipation.  Your gums may bleed and may be sensitive to brushing and flossing. Other changes  You may tire easily.  You may urinate more often.  Your menstrual periods will stop.  You may have a loss of appetite.  You may develop cravings for certain kinds of food.  You may have changes in your emotions from day to day.  You may have more vivid and strange dreams. Follow these instructions at home: Medicines  Follow your health care provider's instructions regarding medicine use. Specific medicines may be either safe or unsafe to take during pregnancy. Do not take any medicines unless told to by your health care provider.  Take a prenatal vitamin that contains at least 600 micrograms (mcg) of folic acid. Eating and drinking  Eat a healthy diet that includes fresh fruits and vegetables, whole grains, good sources of protein such as meat, eggs, or tofu, and low-fat dairy products.  Avoid raw meat and unpasteurized juice, milk, and cheese. These carry germs that can harm you and your baby.  If you feel nauseous or you vomit: ? Eat  4 or 5 small meals a day instead of 3 large meals. ? Try eating a few soda crackers. ? Drink liquids between meals instead of during meals.  You may need to take these actions to prevent or treat constipation: ? Drink enough fluid to keep your urine pale yellow. ? Eat foods that are high in fiber, such as beans, whole grains, and fresh fruits and vegetables. ? Limit foods that are high in fat and processed sugars, such as fried or sweet foods. Activity  Exercise only as directed by your health care provider. Most people can continue their usual exercise routine  during pregnancy. Try to exercise for 30 minutes at least 5 days a week.  Stop exercising if you develop pain or cramping in the lower abdomen or lower back.  Avoid exercising if it is very hot or humid or if you are at high altitude.  Avoid heavy lifting.  If you choose to, you may have sex unless your health care provider tells you not to. Relieving pain and discomfort  Wear a good support bra to relieve breast tenderness.  Rest with your legs elevated if you have leg cramps or low back pain.  If you develop bulging veins (varicose veins) in your legs: ? Wear support hose as told by your health care provider. ? Elevate your feet for 15 minutes, 3-4 times a day. ? Limit salt in your diet. Safety  Wear your seat belt at all times when driving or riding in a car.  Talk with your health care provider if someone is verbally or physically abusive to you.  Talk with your health care provider if you are feeling sad or have thoughts of hurting yourself. Lifestyle  Do not use hot tubs, steam rooms, or saunas.  Do not douche. Do not use tampons or scented sanitary pads.  Do not use herbal remedies, alcohol, illegal drugs, or medicines that are not approved by your health care provider. Chemicals in these products can harm your baby.  Do not use any products that contain nicotine or tobacco, such as cigarettes, e-cigarettes, and chewing tobacco. If you need help quitting, ask your health care provider.  Avoid cat litter boxes and soil used by cats. These carry germs that can cause birth defects in the baby and possibly loss of the unborn baby (fetus) by miscarriage or stillbirth. General instructions  During routine prenatal visits in the first trimester, your health care provider will do a physical exam, perform necessary tests, and ask you how things are going. Keep all follow-up visits. This is important.  Ask for help if you have counseling or nutritional needs during pregnancy.  Your health care provider can offer advice or refer you to specialists for help with various needs.  Schedule a dentist appointment. At home, brush your teeth with a soft toothbrush. Floss gently.  Write down your questions. Take them to your prenatal visits. Where to find more information  American Pregnancy Association: americanpregnancy.org  [employer] of Obstetricians and Gynecologists: https://www.todd-brady.net/  Office on [employer] Health: Mighty[employer].co.nz Contact a health care provider if you have:  Dizziness.  A fever.  Mild pelvic cramps, pelvic pressure, or nagging pain in the abdominal area.  Nausea, vomiting, or diarrhea that lasts for 24 hours or longer.  A bad-smelling vaginal discharge.  Pain when you urinate.  Known exposure to a contagious illness, such as chickenpox, measles, Zika virus, HIV, or hepatitis. Get help right away if you have:  Spotting or bleeding from  your vagina.  Severe abdominal cramping or pain.  Shortness of breath or chest pain.  Any kind of trauma, such as from a fall or a car crash.  New or increased pain, swelling, or redness in an arm or leg. Summary  The first trimester of pregnancy starts on the first day of your last menstrual period until the end of week 12 (months 1 through 3).  Eating 4 or 5 small meals a day rather than 3 large meals may help to relieve nausea and vomiting.  Do not use any products that contain nicotine or tobacco, such as cigarettes, e-cigarettes, and chewing tobacco. If you need help quitting, ask your health care provider.  Keep all follow-up visits. This is important. This information is not intended to replace advice given to you by your health care provider. Make sure you discuss any questions you have with your health care provider. Document Revised: 01/18/2020 Document Reviewed: 11/24/2019 Elsevier Patient Education  2021 Elsevier  Inc.   Trichomoniasis Trichomoniasis is an STI (sexually transmitted infection) that can affect both women and men. In women, the outer area of the female genitalia (vulva) and the vagina are affected. In men, mainly the penis is affected, but the prostate and other reproductive organs can also be involved.  This condition can be treated with medicine. It often has no symptoms (is asymptomatic), especially in men. If not treated, trichomoniasis can last for months or years. What are the causes? This condition is caused by a parasite called Trichomonas vaginalis. Trichomoniasis most often spreads from person to person (is contagious) through sexual contact. What increases the risk? The following factors may make you more likely to develop this condition:  Having unprotected sex.  Having sex with a partner who has trichomoniasis.  Having multiple sexual partners.  Having had previous trichomoniasis infections or other STIs. What are the signs or symptoms? In women, symptoms of trichomoniasis include:  Abnormal vaginal discharge that is clear, white, gray, or yellow-green and foamy and has an unusual "fishy" odor.  Itching and irritation of the vagina and vulva.  Burning or pain during urination or sex.  Redness and swelling of the genitals. In men, symptoms of trichomoniasis include:  Penile discharge that may be foamy or contain pus.  Pain in the penis. This may happen only when urinating.  Itching or irritation inside the penis.  Burning after urination or ejaculation. How is this diagnosed? In women, this condition may be found during a routine Pap test or physical exam. It may be found in men during a routine physical exam. Your health care provider may do tests to help diagnose this infection, such as:  Urine tests (men and women).  The following in women: ? Testing the pH of the vagina. ? A vaginal swab test that checks for the Trichomonas vaginalis  parasite. ? Testing vaginal secretions. Your health care provider may test you for other STIs, including HIV (human immunodeficiency virus). How is this treated? This condition is treated with medicine taken by mouth (orally), such as metronidazole or tinidazole, to fight the infection. Your sexual partner(s) also need to be tested and treated.  If you are a woman and you plan to become pregnant or think you may be pregnant, tell your health care provider right away. Some medicines that are used to treat the infection should not be taken during pregnancy. Your health care provider may recommend over-the-counter medicines or creams to help relieve itching or irritation. You may be tested for  infection again 3 months after treatment.   Follow these instructions at home:  Take and use over-the-counter and prescription medicines, including creams, only as told by your health care provider.  Take your antibiotic medicine as told by your health care provider. Do not stop taking the antibiotic even if you start to feel better.  Do not have sex until 7-10 days after you finish your medicine, or until your health care provider approves. Ask your health care provider when you may start to have sex again.  (Women) Do not douche or wear tampons while you have the infection.  Discuss your infection with your sexual partner(s). Make sure that your partner gets tested and treated, if necessary.  Keep all follow-up visits as told by your health care provider. This is important. How is this prevented?  Use condoms every time you have sex. Using condoms correctly and consistently can help protect against STIs.  Avoid having multiple sexual partners.  Talk with your sexual partner about any symptoms that either of you may have, as well as any history of STIs.  Get tested for STIs and STDs (sexually transmitted diseases) before you have sex. Ask your partner to do the same.  Do not have sexual contact if  you have symptoms of trichomoniasis or another STI.   Contact a health care provider if:  You still have symptoms after you finish your medicine.  You develop pain in your abdomen.  You have pain when you urinate.  You have bleeding after sex.  You develop a rash.  You feel nauseous or you vomit.  You plan to become pregnant or think you may be pregnant. Summary  Trichomoniasis is an STI (sexually transmitted infection) that can affect both women and men.  This condition often has no symptoms (is asymptomatic), especially in men.  Without treatment, this condition can last for months or years.  You should not have sex until 7-10 days after you finish your medicine, or until your health care provider approves. Ask your health care provider when you may start to have sex again.  Discuss your infection with your sexual partner(s). Make sure that your partner gets tested and treated, if necessary. This information is not intended to replace advice given to you by your health care provider. Make sure you discuss any questions you have with your health care provider. Document Revised: 05/25/2018 Document Reviewed: 05/25/2018 Elsevier Patient Education  2021 [employer].

## 2021-02-24 ENCOUNTER — Emergency Department (HOSPITAL_COMMUNITY): Payer: Medicaid Other

## 2021-02-24 ENCOUNTER — Encounter (HOSPITAL_COMMUNITY): Payer: Self-pay | Admitting: Emergency Medicine

## 2021-02-24 ENCOUNTER — Emergency Department (HOSPITAL_COMMUNITY)
Admission: EM | Admit: 2021-02-24 | Discharge: 2021-02-24 | Disposition: A | Discharge status: Home / Self Care | Payer: Medicaid Other | Attending: Emergency Medicine | Admitting: Emergency Medicine

## 2021-02-24 ENCOUNTER — Other Ambulatory Visit: Payer: Self-pay

## 2021-02-24 DIAGNOSIS — I10 Essential (primary) hypertension: Secondary | ICD-10-CM | POA: Insufficient documentation

## 2021-02-24 DIAGNOSIS — J45909 Unspecified asthma, uncomplicated: Secondary | ICD-10-CM | POA: Insufficient documentation

## 2021-02-24 DIAGNOSIS — Y92838 Other recreation area as the place of occurrence of the external cause: Secondary | ICD-10-CM | POA: Insufficient documentation

## 2021-02-24 DIAGNOSIS — Z87891 Personal history of nicotine dependence: Secondary | ICD-10-CM | POA: Insufficient documentation

## 2021-02-24 DIAGNOSIS — S0993XA Unspecified injury of face, initial encounter: Secondary | ICD-10-CM | POA: Diagnosis present

## 2021-02-24 DIAGNOSIS — S0083XA Contusion of other part of head, initial encounter: Secondary | ICD-10-CM | POA: Diagnosis not present

## 2021-02-24 DIAGNOSIS — Z79899 Other long term (current) drug therapy: Secondary | ICD-10-CM | POA: Insufficient documentation

## 2021-02-24 DIAGNOSIS — M542 Cervicalgia: Secondary | ICD-10-CM

## 2021-02-24 LAB — I-STAT CHEM 8, ED
BUN: 15 mg/dL (ref 6–20)
Calcium, Ion: 1.13 mmol/L — ABNORMAL LOW (ref 1.15–1.40)
Chloride: 110 mmol/L (ref 98–111)
Creatinine, Ser: 0.7 mg/dL (ref 0.44–1.00)
Glucose, Bld: 91 mg/dL (ref 70–99)
HCT: 38 % (ref 36.0–46.0)
Hemoglobin: 12.9 g/dL (ref 12.0–15.0)
Potassium: 3.9 mmol/L (ref 3.5–5.1)
Sodium: 141 mmol/L (ref 135–145)
TCO2: 21 mmol/L — ABNORMAL LOW (ref 22–32)

## 2021-02-24 MED ORDER — ACETAMINOPHEN ER 650 MG PO TBCR: 650.0000 mg | EXTENDED_RELEASE_TABLET | Freq: Three times a day (TID) | ORAL | 0 refills | Status: DC

## 2021-02-24 MED ORDER — NAPROXEN 500 MG PO TABS
500.0000 mg | ORAL_TABLET | Freq: Two times a day (BID) | ORAL | 0 refills | Status: DC
  Filled 2021-02-24: qty 20, 10d supply, fill #0

## 2021-02-24 MED ORDER — OXYCODONE-ACETAMINOPHEN 5-325 MG PO TABS
1.0000 | ORAL_TABLET | Freq: Once | ORAL | Status: AC
  Administered 2021-02-24: 12:00:00 1 via ORAL
  Filled 2021-02-24: qty 1

## 2021-02-24 MED ORDER — OXYCODONE-ACETAMINOPHEN 5-325 MG PO TABS
1.0000 | ORAL_TABLET | Freq: Once | ORAL | Status: AC
  Administered 2021-02-24: 08:00:00 1 via ORAL
  Filled 2021-02-24: qty 1

## 2021-02-24 MED ORDER — CEPHALEXIN 500 MG PO CAPS
500.0000 mg | ORAL_CAPSULE | Freq: Four times a day (QID) | ORAL | 0 refills | Status: DC
  Filled 2021-02-24: qty 20, 5d supply, fill #0

## 2021-02-24 MED ORDER — IBUPROFEN 400 MG PO TABS
600.0000 mg | ORAL_TABLET | Freq: Once | ORAL | Status: AC
  Administered 2021-02-24: 12:00:00 600 mg via ORAL
  Filled 2021-02-24: qty 1

## 2021-02-24 MED ORDER — IBUPROFEN 600 MG PO TABS: 600.0000 mg | ORAL_TABLET | Freq: Three times a day (TID) | ORAL | 0 refills | Status: AC

## 2021-02-24 MED ORDER — IOHEXOL 350 MG/ML SOLN
50.0000 mL | Freq: Once | INTRAVENOUS | Status: AC | PRN
  Administered 2021-02-24: 10:00:00 50 mL via INTRAVENOUS

## 2021-02-24 MED ORDER — ACETAMINOPHEN ER 650 MG PO TBCR
650.0000 mg | EXTENDED_RELEASE_TABLET | Freq: Three times a day (TID) | ORAL | 0 refills | Status: DC
  Filled 2021-02-24: qty 30, 10d supply, fill #0

## 2021-02-24 MED ORDER — CEPHALEXIN 500 MG PO CAPS: 500.0000 mg | ORAL_CAPSULE | Freq: Four times a day (QID) | ORAL | 0 refills | Status: DC

## 2021-02-24 NOTE — ED Provider Notes (Signed)
  Physical Exam  BP (!) 167/120   Pulse 87   Temp 98.2 F (36.8 C) (Oral)   Resp 18   LMP  (LMP Unknown)   SpO2 100%   Breastfeeding Unknown   Physical Exam  ED Course/Procedures     Procedures  MDM   The patient appears reasonably screened and/or stabilized for discharge and I doubt any other medical condition or other [facility] requiring further screening, evaluation, or treatment in the ED at this time prior to discharge.   Results from the ER workup discussed with the patient face to face and all questions answered to the best of my ability. The patient is safe for discharge with strict return precautions.   NSAIDS, ICE, soft diet, antibiotics. Strict ER return precautions have been discussed, and patient is agreeing with the plan and is comfortable with the workup done and the recommendations from the ER.        Derwood Kaplan, MD 02/24/21 1229
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
## 2021-02-24 NOTE — Discharge Instructions (Addendum)
The CT scans are reassuring.  You do have injury and swelling of the parotid gland that sits over your jaw.  This is why there is swelling to the face.  We are putting you on antibiotics to prevent infection. We are giving you medications to reduce inflammation and swelling. Make sure you are icing the area of swelling 4 times a day for 10 to 15 minutes each  Return to the ER if you start having worsening pain, fevers, chills, headaches, confusion.

## 2021-02-26 ENCOUNTER — Other Ambulatory Visit (HOSPITAL_COMMUNITY): Payer: Self-pay

## 2021-05-30 ENCOUNTER — Inpatient Hospital Stay (HOSPITAL_COMMUNITY): Payer: Medicaid Other

## 2021-05-30 ENCOUNTER — Encounter (HOSPITAL_COMMUNITY): Payer: Self-pay

## 2021-05-30 ENCOUNTER — Inpatient Hospital Stay (HOSPITAL_COMMUNITY)
Admission: AD | Admit: 2021-05-30 | Discharge: 2021-05-30 | Disposition: A | Discharge status: Home / Self Care | Payer: Medicaid Other | Attending: Obstetrics and Gynecology | Admitting: Obstetrics and Gynecology

## 2021-05-30 DIAGNOSIS — O10911 Unspecified pre-existing hypertension complicating pregnancy, first trimester: Secondary | ICD-10-CM

## 2021-05-30 DIAGNOSIS — R519 Headache, unspecified: Secondary | ICD-10-CM | POA: Diagnosis not present

## 2021-05-30 DIAGNOSIS — Z87891 Personal history of nicotine dependence: Secondary | ICD-10-CM | POA: Insufficient documentation

## 2021-05-30 DIAGNOSIS — Z20822 Contact with and (suspected) exposure to covid-19: Secondary | ICD-10-CM | POA: Insufficient documentation

## 2021-05-30 DIAGNOSIS — O26891 Other specified pregnancy related conditions, first trimester: Secondary | ICD-10-CM | POA: Diagnosis not present

## 2021-05-30 DIAGNOSIS — Z3A01 Less than 8 weeks gestation of pregnancy: Secondary | ICD-10-CM

## 2021-05-30 DIAGNOSIS — O219 Vomiting of pregnancy, unspecified: Secondary | ICD-10-CM

## 2021-05-30 DIAGNOSIS — R109 Unspecified abdominal pain: Secondary | ICD-10-CM

## 2021-05-30 DIAGNOSIS — O3680X0 Pregnancy with inconclusive fetal viability, not applicable or unspecified: Secondary | ICD-10-CM

## 2021-05-30 LAB — URINALYSIS, ROUTINE W REFLEX MICROSCOPIC
Bilirubin Urine: NEGATIVE
Glucose, UA: NEGATIVE mg/dL
Hgb urine dipstick: NEGATIVE
Ketones, ur: 5 mg/dL — AB
Nitrite: NEGATIVE
Protein, ur: NEGATIVE mg/dL
Specific Gravity, Urine: 1.019 (ref 1.005–1.030)
pH: 6 (ref 5.0–8.0)

## 2021-05-30 LAB — CBC
HCT: 36 % (ref 36.0–46.0)
Hemoglobin: 12.2 g/dL (ref 12.0–15.0)
MCH: 29.1 pg (ref 26.0–34.0)
MCHC: 33.9 g/dL (ref 30.0–36.0)
MCV: 85.9 fL (ref 80.0–100.0)
Platelets: 223 10*3/uL (ref 150–400)
RBC: 4.19 MIL/uL (ref 3.87–5.11)
RDW: 14.6 % (ref 11.5–15.5)
WBC: 6.3 10*3/uL (ref 4.0–10.5)
nRBC: 0 % (ref 0.0–0.2)

## 2021-05-30 LAB — RESP PANEL BY RT-PCR (FLU A&B, COVID) ARPGX2
Influenza A by PCR: NEGATIVE
Influenza B by PCR: NEGATIVE
SARS Coronavirus 2 by RT PCR: NEGATIVE

## 2021-05-30 LAB — WET PREP, GENITAL
Clue Cells Wet Prep HPF POC: NONE SEEN
Sperm: NONE SEEN
Trich, Wet Prep: NONE SEEN
WBC, Wet Prep HPF POC: NONE SEEN
Yeast Wet Prep HPF POC: NONE SEEN

## 2021-05-30 LAB — HCG, QUANTITATIVE, PREGNANCY: hCG, Beta Chain, Quant, S: 1939 m[IU]/mL — ABNORMAL HIGH (ref <5)

## 2021-05-30 LAB — POCT PREGNANCY, URINE: Preg Test, Ur: POSITIVE — AB

## 2021-05-30 MED ORDER — METOCLOPRAMIDE HCL 5 MG/ML IJ SOLN
10.0000 mg | Freq: Once | INTRAMUSCULAR | Status: AC
  Administered 2021-05-30: 10 mg via INTRAVENOUS
  Filled 2021-05-30: qty 2

## 2021-05-30 MED ORDER — ACETAMINOPHEN 500 MG PO TABS: 1000.0000 mg | ORAL_TABLET | Freq: Once | ORAL | Status: DC

## 2021-05-30 MED ORDER — ONDANSETRON 4 MG PO TBDP: 4.0000 mg | ORAL_TABLET | Freq: Three times a day (TID) | ORAL | 0 refills | Status: DC | PRN

## 2021-05-30 MED ORDER — METOCLOPRAMIDE HCL 10 MG PO TABS: 10.0000 mg | ORAL_TABLET | Freq: Four times a day (QID) | ORAL | 0 refills | Status: DC

## 2021-05-30 MED ORDER — NIFEDIPINE ER OSMOTIC RELEASE 30 MG PO TB24: 30.0000 mg | ORAL_TABLET | Freq: Every day | ORAL | 5 refills | Status: DC

## 2021-05-30 MED ORDER — CYCLOBENZAPRINE HCL 10 MG PO TABS: 10.0000 mg | ORAL_TABLET | Freq: Two times a day (BID) | ORAL | 0 refills | Status: DC | PRN

## 2021-05-30 MED ORDER — CYCLOBENZAPRINE HCL 5 MG PO TABS
10.0000 mg | ORAL_TABLET | Freq: Once | ORAL | Status: AC
  Administered 2021-05-30: 10 mg via ORAL
  Filled 2021-05-30: qty 2

## 2021-05-30 MED ORDER — LACTATED RINGERS IV BOLUS
1000.0000 mL | Freq: Once | INTRAVENOUS | Status: AC
  Administered 2021-05-30: 1000 mL via INTRAVENOUS

## 2021-05-30 MED ORDER — ONDANSETRON HCL 4 MG/2ML IJ SOLN
4.0000 mg | Freq: Once | INTRAMUSCULAR | Status: AC
  Administered 2021-05-30: 4 mg via INTRAVENOUS
  Filled 2021-05-30: qty 2

## 2021-05-30 NOTE — MAU Note (Signed)
.  Sydney Henry is a 30 y.o. at [redacted]w[redacted]d here in MAU reporting: lower abdominal and back pain that started 05/22/2021. Denies VB or abnormal discharge. Patient also having headache she rates 7/10.Took 500 mg tylenol at 1315. Patient states she threw up about 1400.    Vitals:   05/30/21 1601  BP: (!) 170/98  Pulse: 82  Resp: 17  Temp: 99.2 F (37.3 C)  SpO2: 100%      Lab orders placed from triage:  UA
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
## 2021-05-31 LAB — COMPREHENSIVE METABOLIC PANEL
ALT: 14 U/L (ref 0–44)
AST: 26 U/L (ref 15–41)
Albumin: 4 g/dL (ref 3.5–5.0)
Alkaline Phosphatase: 52 U/L (ref 38–126)
Anion gap: 8 (ref 5–15)
BUN: 7 mg/dL (ref 6–20)
CO2: 22 mmol/L (ref 22–32)
Calcium: 9.5 mg/dL (ref 8.9–10.3)
Chloride: 105 mmol/L (ref 98–111)
Creatinine, Ser: 0.76 mg/dL (ref 0.44–1.00)
GFR, Estimated: >60 mL/min (ref >60)
Glucose, Bld: 89 mg/dL (ref 70–99)
Potassium: 3.5 mmol/L (ref 3.5–5.1)
Sodium: 135 mmol/L (ref 135–145)
Total Bilirubin: 0.7 mg/dL (ref 0.3–1.2)
Total Protein: 7.3 g/dL (ref 6.5–8.1)

## 2021-05-31 LAB — GC/CHLAMYDIA PROBE AMP (~~LOC~~) NOT AT ARMC
Chlamydia: NEGATIVE
Comment: NEGATIVE
Comment: NORMAL
Neisseria Gonorrhea: NEGATIVE

## 2021-06-01 ENCOUNTER — Inpatient Hospital Stay (HOSPITAL_COMMUNITY)
Admission: AD | Admit: 2021-06-01 | Discharge: 2021-06-01 | Disposition: A | Discharge status: Home / Self Care | Payer: Medicaid Other | Attending: Obstetrics and Gynecology | Admitting: Obstetrics and Gynecology

## 2021-06-01 ENCOUNTER — Other Ambulatory Visit: Payer: Self-pay

## 2021-06-01 DIAGNOSIS — O3680X0 Pregnancy with inconclusive fetal viability, not applicable or unspecified: Secondary | ICD-10-CM | POA: Insufficient documentation

## 2021-06-01 DIAGNOSIS — Z3A01 Less than 8 weeks gestation of pregnancy: Secondary | ICD-10-CM | POA: Insufficient documentation

## 2021-06-01 DIAGNOSIS — O26891 Other specified pregnancy related conditions, first trimester: Secondary | ICD-10-CM

## 2021-06-01 LAB — HCG, QUANTITATIVE, PREGNANCY: hCG, Beta Chain, Quant, S: 3587 m[IU]/mL — ABNORMAL HIGH (ref <5)

## 2021-06-01 NOTE — Discharge Instructions (Signed)
Expect to get a call from ultrasound to schedule a follow up.  Expect the call on Monday or Tuesday.  The [place] will be scheduled later.

## 2021-06-01 NOTE — MAU Note (Signed)
Presents for HCG level.  Denies VB, reports minor cramping.

## 2021-06-01 NOTE — MAU Provider Note (Signed)
History   Chief Complaint:  HCG Level   Sydney Henry is  30 y.o. 1989 Patient's last menstrual period was 04/30/2021.Marland Kitchen Patient is here for follow up of quantitative HCG and ongoing surveillance of pregnancy status.   She is [redacted]w[redacted]d weeks gestation  by LMP.    Since her last visit, the patient is without new complaint.   The patient reports bleeding as  less than before.  She picked up her recently prescribed meds and is feeling some better.  General ROS:  pain is lessening, no bleeding, feeling better than at her last visit.  Her previous Quantitative HCG values are: 1939 on 05-30-21     Physical Exam   Blood pressure (!) 155/100, pulse 71, temperature 98.4 F (36.9 C), temperature source Oral, resp. rate 20, height 5\' 8"  (1.727 m), weight 86.7 kg, last menstrual period 04/30/2021, SpO2 100 %, unknown if currently breastfeeding. GENERAL: Well-developed, well-nourished female in no acute distress.  HEENT: Normocephalic, atraumatic.  LUNGS: Effort normal  HEART: Regular rate  SKIN: Warm, dry and without erythema  PSYCH: Normal mood and affect  Patient to leave and will call with quant results   Labs: Results for orders placed or performed during the hospital encounter of 06/01/21 (from the past 24 hour(s))  hCG, quantitative, pregnancy   Collection Time: 06/01/21  1:40 PM  Result Value Ref Range   hCG, Beta Chain, Quant, S 3,587 (H) <5 mIU/mL    Ultrasound Studies:   08/01/21 OB LESS THAN 14 WEEKS WITH OB TRANSVAGINAL  Result Date: 05/30/2021 CLINICAL DATA:  Lower abdominal, back pain since 05/22/2021. Gestational age by LMP 4 weeks 2 days EXAM: OBSTETRIC <14 WK 05/24/2021 AND TRANSVAGINAL OB US TECHNIQUE: Both transabdominal and transvaginal ultrasound examinations were performed for complete evaluation of the gestation as well as the maternal uterus, adnexal regions, and pelvic cul-de-sac. Transvaginal technique was performed to assess early pregnancy. COMPARISON:  Prior obstetric  ultrasound dated 11/01/2020 FINDINGS: Intrauterine gestational sac: Single Yolk sac:  Not Visualized. Embryo:  Not Visualized. Cardiac Activity: Not Visualized. Heart Rate: N/a bpm MSD: 3.4 mm   5 w   0  d Subchorionic hemorrhage:  None visualized. Maternal uterus/adnexae: A small corpus luteum is seen on the right. The uterus and ovaries are otherwise unremarkable. IMPRESSION: Early intrauterine gestational sac identified with no yolk sac, no fetal pole, or cardiac activity yet visualized. Recommend follow-up quantitative B-HCG levels and follow-up 01/01/2021 in 14 days to assess viability. This recommendation follows SRU consensus guidelines: Diagnostic Criteria for Nonviable Pregnancy Early in the First Trimester. [place] Med [id number]. Electronically Signed   By: ; [id number] M.D.   On: 05/30/2021 18:12    Assessment:  [redacted]w[redacted]d weeks gestation, pregnancy of unknown anatomic location Quant has increased significantly, not quite doubled Previous symptoms are lessening - no worsening Taking previously prescribed medications BP still elevated but less than last visit.  Plan: Patient called and 2 identifiers verified.   The patient is instructed that [redacted]w[redacted]d will call her to schedule another [place] which would be scheduled on 06-06-21 or after.   Return to MAU earlier if having worsening symptoms.  Sydney Henry 06/01/2021, 3:51 PM

## 2021-06-10 ENCOUNTER — Other Ambulatory Visit: Payer: Self-pay | Admitting: Family Medicine

## 2021-06-10 DIAGNOSIS — O3680X0 Pregnancy with inconclusive fetal viability, not applicable or unspecified: Secondary | ICD-10-CM

## 2021-06-13 ENCOUNTER — Other Ambulatory Visit: Payer: Self-pay | Admitting: Family Medicine

## 2021-06-13 ENCOUNTER — Other Ambulatory Visit: Payer: Self-pay

## 2021-06-13 ENCOUNTER — Ambulatory Visit
Admission: RE | Admit: 2021-06-13 | Discharge: 2021-06-13 | Disposition: A | Discharge status: Home / Self Care | Payer: Medicaid Other | Source: Ambulatory Visit | Attending: Family Medicine | Admitting: Family Medicine

## 2021-06-13 ENCOUNTER — Ambulatory Visit (INDEPENDENT_AMBULATORY_CARE_PROVIDER_SITE_OTHER): Payer: Medicaid Other

## 2021-06-13 VITALS — BP 130/80 | HR 69 | Wt 196.4 lb

## 2021-06-13 DIAGNOSIS — O3680X0 Pregnancy with inconclusive fetal viability, not applicable or unspecified: Secondary | ICD-10-CM

## 2021-06-13 DIAGNOSIS — Z712 Person consulting for explanation of examination or test findings: Secondary | ICD-10-CM | POA: Diagnosis not present

## 2021-06-13 NOTE — Progress Notes (Signed)
Ultrasound Result Visit  Here today for results following [place] for viability. Results reviewed with Sydney Henry, CNM who finds single, living IUP at [redacted]w[redacted]d. Provider also finds small subchorionic hemorrhage on ultrasound. Recommends pelvic rest and for pt to begin prenatal care. Pt is 107w2d by LMP, will continue to use Sydney of 02/04/22 based on LMP.   Reviewed results and provider recommendation with patient. Pt will call [facility] to schedule new OB appts. Pt encouraged to follow up with [facility] as needed.   Fleet Contras RN 06/13/21

## 2021-06-14 NOTE — Progress Notes (Signed)
Patient was assessed and managed by nursing staff during this encounter. I have reviewed the chart and agree with the documentation and plan.   Edd Arbour, MSN, CNM, IBCLC 06/14/21 8:21 AM

## 2021-07-04 ENCOUNTER — Inpatient Hospital Stay (HOSPITAL_COMMUNITY): Payer: Medicaid Other

## 2021-07-04 ENCOUNTER — Encounter (HOSPITAL_COMMUNITY): Payer: Self-pay | Admitting: Obstetrics & Gynecology

## 2021-07-04 ENCOUNTER — Inpatient Hospital Stay (HOSPITAL_COMMUNITY)
Admission: AD | Admit: 2021-07-04 | Discharge: 2021-07-04 | Disposition: A | Discharge status: Home / Self Care | Payer: Medicaid Other | Attending: Obstetrics & Gynecology | Admitting: Obstetrics & Gynecology

## 2021-07-04 ENCOUNTER — Other Ambulatory Visit: Payer: Self-pay

## 2021-07-04 DIAGNOSIS — O468X1 Other antepartum hemorrhage, first trimester: Secondary | ICD-10-CM

## 2021-07-04 DIAGNOSIS — Z3A09 9 weeks gestation of pregnancy: Secondary | ICD-10-CM | POA: Diagnosis not present

## 2021-07-04 DIAGNOSIS — O99891 Other specified diseases and conditions complicating pregnancy: Secondary | ICD-10-CM

## 2021-07-04 DIAGNOSIS — O208 Other hemorrhage in early pregnancy: Secondary | ICD-10-CM | POA: Diagnosis not present

## 2021-07-04 DIAGNOSIS — N730 Acute parametritis and pelvic cellulitis: Secondary | ICD-10-CM

## 2021-07-04 DIAGNOSIS — R102 Pelvic and perineal pain: Secondary | ICD-10-CM | POA: Insufficient documentation

## 2021-07-04 DIAGNOSIS — Z87891 Personal history of nicotine dependence: Secondary | ICD-10-CM | POA: Insufficient documentation

## 2021-07-04 DIAGNOSIS — R109 Unspecified abdominal pain: Secondary | ICD-10-CM

## 2021-07-04 DIAGNOSIS — O26891 Other specified pregnancy related conditions, first trimester: Secondary | ICD-10-CM | POA: Diagnosis not present

## 2021-07-04 DIAGNOSIS — O3680X0 Pregnancy with inconclusive fetal viability, not applicable or unspecified: Secondary | ICD-10-CM

## 2021-07-04 DIAGNOSIS — O418X10 Other specified disorders of amniotic fluid and membranes, first trimester, not applicable or unspecified: Secondary | ICD-10-CM

## 2021-07-04 LAB — WET PREP, GENITAL
Sperm: NONE SEEN
Trich, Wet Prep: NONE SEEN
Yeast Wet Prep HPF POC: NONE SEEN

## 2021-07-04 LAB — CBC
HCT: 35.3 % — ABNORMAL LOW (ref 36.0–46.0)
Hemoglobin: 11.7 g/dL — ABNORMAL LOW (ref 12.0–15.0)
MCH: 29.5 pg (ref 26.0–34.0)
MCHC: 33.1 g/dL (ref 30.0–36.0)
MCV: 88.9 fL (ref 80.0–100.0)
Platelets: 211 10*3/uL (ref 150–400)
RBC: 3.97 MIL/uL (ref 3.87–5.11)
RDW: 14.8 % (ref 11.5–15.5)
WBC: 6.5 10*3/uL (ref 4.0–10.5)
nRBC: 0 % (ref 0.0–0.2)

## 2021-07-04 LAB — URINALYSIS, ROUTINE W REFLEX MICROSCOPIC
Bilirubin Urine: NEGATIVE
Glucose, UA: NEGATIVE mg/dL
Hgb urine dipstick: NEGATIVE
Ketones, ur: NEGATIVE mg/dL
Nitrite: NEGATIVE
Protein, ur: NEGATIVE mg/dL
Specific Gravity, Urine: 1.016 (ref 1.005–1.030)
Squamous Epithelial / HPF: >50 — ABNORMAL HIGH (ref 0–5)
pH: 7 (ref 5.0–8.0)

## 2021-07-04 MED ORDER — LIDOCAINE HCL (PF) 1 % IJ SOLN
2.0000 mL | Freq: Once | INTRAMUSCULAR | Status: AC
  Administered 2021-07-04: 2 mL
  Filled 2021-07-04: qty 5

## 2021-07-04 MED ORDER — AZITHROMYCIN 250 MG PO TABS: 1000.0000 mg | ORAL_TABLET | Freq: Once | ORAL | Status: DC

## 2021-07-04 MED ORDER — OXYCODONE HCL 5 MG PO TABS
5.0000 mg | ORAL_TABLET | Freq: Once | ORAL | Status: AC
  Administered 2021-07-04: 5 mg via ORAL
  Filled 2021-07-04: qty 1

## 2021-07-04 MED ORDER — CYCLOBENZAPRINE HCL 5 MG PO TABS
5.0000 mg | ORAL_TABLET | Freq: Once | ORAL | Status: AC
  Administered 2021-07-04: 5 mg via ORAL
  Filled 2021-07-04: qty 1

## 2021-07-04 MED ORDER — METRONIDAZOLE 500 MG PO TABS
500.0000 mg | ORAL_TABLET | Freq: Once | ORAL | Status: AC
  Administered 2021-07-04: 500 mg via ORAL
  Filled 2021-07-04: qty 1

## 2021-07-04 MED ORDER — OXYCODONE HCL 5 MG PO TABS: 5.0000 mg | ORAL_TABLET | ORAL | 0 refills | Status: DC | PRN

## 2021-07-04 MED ORDER — ACETAMINOPHEN 500 MG PO TABS
1000.0000 mg | ORAL_TABLET | Freq: Once | ORAL | Status: AC
  Administered 2021-07-04: 1000 mg via ORAL
  Filled 2021-07-04: qty 2

## 2021-07-04 MED ORDER — CEFTRIAXONE SODIUM 500 MG IJ SOLR
500.0000 mg | Freq: Once | INTRAMUSCULAR | Status: AC
  Administered 2021-07-04: 500 mg via INTRAMUSCULAR
  Filled 2021-07-04: qty 500

## 2021-07-04 MED ORDER — METRONIDAZOLE 500 MG PO TABS: 500.0000 mg | ORAL_TABLET | Freq: Two times a day (BID) | ORAL | 0 refills | Status: DC

## 2021-07-04 MED ORDER — AZITHROMYCIN 250 MG PO TABS
1000.0000 mg | ORAL_TABLET | Freq: Once | ORAL | Status: AC
  Administered 2021-07-04: 1000 mg via ORAL
  Filled 2021-07-04: qty 4
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
## 2021-07-04 NOTE — Progress Notes (Signed)
Wet prep & GC/Chlamydia cultures obtained by Dr. Crissie Reese.

## 2021-07-04 NOTE — MAU Note (Signed)
Presents with c/o sharp pelvic pain on the right side for 2 days, states during the night pain is now on both side and is constant.  Denies VB.  Reports took Tylenol @ 0100, pain decreases but remains.

## 2021-07-05 LAB — GC/CHLAMYDIA PROBE AMP (~~LOC~~) NOT AT ARMC
Chlamydia: NEGATIVE
Comment: NEGATIVE
Comment: NORMAL
Neisseria Gonorrhea: NEGATIVE

## 2021-07-05 LAB — CULTURE, OB URINE

## 2021-07-06 LAB — AEROBIC CULTURE W GRAM STAIN (SUPERFICIAL SPECIMEN)

## 2021-07-17 ENCOUNTER — Other Ambulatory Visit: Payer: Self-pay

## 2021-07-17 ENCOUNTER — Ambulatory Visit (INDEPENDENT_AMBULATORY_CARE_PROVIDER_SITE_OTHER): Payer: Medicaid Other

## 2021-07-17 DIAGNOSIS — O0990 Supervision of high risk pregnancy, unspecified, unspecified trimester: Secondary | ICD-10-CM | POA: Insufficient documentation

## 2021-07-17 MED ORDER — BLOOD PRESSURE KIT DEVI
1.0000 | 0 refills | Status: DC
Start: 2021-07-17 — End: 2022-04-09

## 2021-07-17 MED ORDER — GOJJI WEIGHT SCALE MISC
1.0000 | 0 refills | Status: DC
Start: 2021-07-17 — End: 2022-04-09

## 2021-07-17 NOTE — Progress Notes (Signed)
New OB Intake  I connected with  Sydney Henry on 07/17/21 at  2:00 PM EST by telephone Video Visit and verified that I am speaking with the correct person using two identifiers. Nurse is located at [facility] and pt is located at Home.  I discussed the limitations, risks, security and privacy concerns of performing an evaluation and management service by telephone and the availability of in person appointments. I also discussed with the patient that there may be a patient responsible charge related to this service. The patient expressed understanding and agreed to proceed.  I explained I am completing New OB Intake today. We discussed her EDD of 02/04/21 that is based on LMP of 04/30/21. Pt is G6/P2032. I reviewed her allergies, medications, Medical/Surgical/OB history, and appropriate screenings. I informed her of [facility] services. Based on history, this is a/an  pregnancy complicated by hypertension .   Patient Active Problem List   Diagnosis Date Noted   Trichomonal vaginitis during pregnancy 11/01/2020   Pelvic pain affecting pregnancy 11/01/2020   Chronic hypertension 11/01/2020   BMI 30s 02/15/2018    Concerns addressed today  Delivery Plans:  Plans to deliver at [facility].   MyChart/Babyscripts MyChart access verified. I explained pt will have some visits in office and some virtually. Babyscripts instructions given and order placed. Patient verifies receipt of registration text/e-mail. Account successfully created and app downloaded.  Blood Pressure Cuff  Blood pressure cuff ordered for patient to pick-up from [employer]. Explained after first prenatal appt pt will check weekly and document in Babyscripts.  Weight scale: Patient does not have weight scale. Weight scale ordered for patient to pick up form [facility].   Anatomy US Explained first scheduled [place] will be around 19 weeks.   Labs Discussed Avelina Laine genetic screening with patient. Would like both Panorama and  Horizon drawn at new OB visit. Routine prenatal labs needed.  Covid Vaccine Patient has covid vaccine.    Social Determinants of Health Food Insecurity: Patient denies food insecurity. WIC Referral: Patient is interested in referral to [facility].  Transportation: Patient denies transportation needs. Childcare: Discussed no children allowed at ultrasound appointments. Offered childcare services; patient expresses need for childcare services. Childcare scheduled for appropriate appointments and information given to patient.  Send link to Pregnancy Navigators   Placed OB Box on problem list and updated  First visit review I reviewed new OB appt with pt. I explained she will have a pelvic exam, ob bloodwork with genetic screening, and PAP smear. Explained pt will be seen by Peggy Constant at first visit; encounter routed to appropriate provider. Explained that patient will be seen by pregnancy navigator following visit with provider. [facility] information placed in AVS.   Hamilton Capri, RN 07/17/2021  1:39 PM

## 2021-07-17 NOTE — Progress Notes (Signed)
Patient was assessed and managed by nursing staff during this encounter. I have reviewed the chart and agree with the documentation and plan. I have also made any necessary editorial changes.  Catalina Antigua, MD 07/17/2021 2:08 PM

## 2021-07-25 ENCOUNTER — Encounter: Payer: Medicaid Other | Admitting: Obstetrics and Gynecology

## 2021-07-31 ENCOUNTER — Telehealth: Payer: Self-pay

## 2021-07-31 NOTE — Telephone Encounter (Addendum)
Received VM from Babyscripts regarding elevated BP 126/96. Unable to reach pt LVM for pt to contact the office or report to MAU after hours if needed.

## 2021-08-13 ENCOUNTER — Telehealth: Payer: Self-pay

## 2021-08-13 NOTE — Telephone Encounter (Signed)
Breast pump order received from [employer]. Order signed and dated for 08/13/21. Faxed back and confirmation received.

## 2021-08-16 ENCOUNTER — Other Ambulatory Visit (HOSPITAL_COMMUNITY)
Admission: RE | Admit: 2021-08-16 | Discharge: 2021-08-16 | Disposition: A | Discharge status: Home / Self Care | Payer: Medicaid Other | Source: Ambulatory Visit | Attending: Obstetrics & Gynecology | Admitting: Obstetrics & Gynecology

## 2021-08-16 ENCOUNTER — Ambulatory Visit (INDEPENDENT_AMBULATORY_CARE_PROVIDER_SITE_OTHER): Payer: Medicaid Other | Admitting: Obstetrics & Gynecology

## 2021-08-16 ENCOUNTER — Other Ambulatory Visit: Payer: Self-pay

## 2021-08-16 VITALS — BP 117/77 | HR 73 | Wt 199.0 lb

## 2021-08-16 DIAGNOSIS — O0990 Supervision of high risk pregnancy, unspecified, unspecified trimester: Secondary | ICD-10-CM

## 2021-08-16 DIAGNOSIS — O0992 Supervision of high risk pregnancy, unspecified, second trimester: Secondary | ICD-10-CM

## 2021-08-16 DIAGNOSIS — Z3A15 15 weeks gestation of pregnancy: Secondary | ICD-10-CM

## 2021-08-16 DIAGNOSIS — I10 Essential (primary) hypertension: Secondary | ICD-10-CM | POA: Diagnosis not present

## 2021-08-16 MED ORDER — ASPIRIN EC 81 MG PO TBEC: 81.0000 mg | DELAYED_RELEASE_TABLET | Freq: Every day | ORAL | 2 refills | Status: DC

## 2021-08-16 MED ORDER — PANTOPRAZOLE SODIUM 20 MG PO TBEC: 20.0000 mg | DELAYED_RELEASE_TABLET | Freq: Every day | ORAL | 4 refills | Status: DC

## 2021-08-16 NOTE — Progress Notes (Signed)
New OB 15.3 wks OB panel, OB urine, GC/CC, Pap (2018) Genetic screening offered and accepted Depression and anxiety screen collected at [facility] Intake and was negative.
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
## 2021-08-17 LAB — CBC/D/PLT+RPR+RH+ABO+RUBIGG...
Antibody Screen: NEGATIVE
Basophils Absolute: 0 10*3/uL (ref 0.0–0.2)
Basos: 1 %
EOS (ABSOLUTE): 0.3 10*3/uL (ref 0.0–0.4)
Eos: 5 %
HCV Ab: 0.2 s/co ratio (ref 0.0–0.9)
HIV Screen 4th Generation wRfx: NONREACTIVE
Hematocrit: 36.7 % (ref 34.0–46.6)
Hemoglobin: 12.5 g/dL (ref 11.1–15.9)
Hepatitis B Surface Ag: NEGATIVE
Immature Grans (Abs): 0 10*3/uL (ref 0.0–0.1)
Immature Granulocytes: 0 %
Lymphocytes Absolute: 1.6 10*3/uL (ref 0.7–3.1)
Lymphs: 27 %
MCH: 30 pg (ref 26.6–33.0)
MCHC: 34.1 g/dL (ref 31.5–35.7)
MCV: 88 fL (ref 79–97)
Monocytes Absolute: 0.5 10*3/uL (ref 0.1–0.9)
Monocytes: 8 %
Neutrophils Absolute: 3.6 10*3/uL (ref 1.4–7.0)
Neutrophils: 59 %
Platelets: 208 10*3/uL (ref 150–450)
RBC: 4.17 x10E6/uL (ref 3.77–5.28)
RDW: 13.7 % (ref 11.7–15.4)
RPR Ser Ql: NONREACTIVE
Rh Factor: POSITIVE
Rubella Antibodies, IGG: 1.92 index (ref >0.99)
WBC: 6 10*3/uL (ref 3.4–10.8)

## 2021-08-17 LAB — COMPREHENSIVE METABOLIC PANEL
ALT: 7 IU/L (ref 0–32)
AST: 11 IU/L (ref 0–40)
Albumin/Globulin Ratio: 1.5 (ref 1.2–2.2)
Albumin: 4.3 g/dL (ref 3.9–5.0)
Alkaline Phosphatase: 59 IU/L (ref 44–121)
BUN/Creatinine Ratio: 11 (ref 9–23)
BUN: 6 mg/dL (ref 6–20)
Bilirubin Total: <0.2 mg/dL (ref 0.0–1.2)
CO2: 21 mmol/L (ref 20–29)
Calcium: 9.1 mg/dL (ref 8.7–10.2)
Chloride: 101 mmol/L (ref 96–106)
Creatinine, Ser: 0.57 mg/dL (ref 0.57–1.00)
Globulin, Total: 2.8 g/dL (ref 1.5–4.5)
Glucose: 72 mg/dL (ref 70–99)
Potassium: 4.1 mmol/L (ref 3.5–5.2)
Sodium: 135 mmol/L (ref 134–144)
Total Protein: 7.1 g/dL (ref 6.0–8.5)
eGFR: 127 mL/min/{1.73_m2} (ref >59)

## 2021-08-17 LAB — HCV INTERPRETATION

## 2021-08-17 LAB — PROTEIN / CREATININE RATIO, URINE
Creatinine, Urine: 225.1 mg/dL
Protein, Ur: 37 mg/dL
Protein/Creat Ratio: 164 mg/g creat (ref 0–200)

## 2021-08-18 LAB — URINE CULTURE, OB REFLEX

## 2021-08-18 LAB — CULTURE, OB URINE

## 2021-08-20 ENCOUNTER — Other Ambulatory Visit: Payer: Self-pay

## 2021-08-20 DIAGNOSIS — O0990 Supervision of high risk pregnancy, unspecified, unspecified trimester: Secondary | ICD-10-CM

## 2021-08-20 LAB — CERVICOVAGINAL ANCILLARY ONLY
Chlamydia: NEGATIVE
Comment: NEGATIVE
Comment: NORMAL
Neisseria Gonorrhea: NEGATIVE

## 2021-08-20 NOTE — Progress Notes (Signed)
Pt was seen on 08/16/21 and order was not put in for pap. Pap placed per cytology. Specimen received without order.

## 2021-08-25 NOTE — L&D Delivery Note (Signed)
Delivery Note At 0345 a viable female infant was delivered via SVD, presentation: OA. APGAR: 9, 9; weight pending.   Placenta status: spontaneously delivered intact with gentle cord traction. Fundus firm with massage and Pitocin.   Anesthesia: epidural Lacerations: periclitoral-hemostatic Suture used for repair: n/a Est. Blood Loss (mL): 50 Placenta to LD Complications none Cord ph n/a   Mom to postpartum. Baby to Couplet care / Skin to Skin.    Julianne Handler, CNM 01/13/2022 4:01 AM

## 2021-08-27 LAB — CYTOLOGY - PAP
Comment: NEGATIVE
Diagnosis: UNDETERMINED — AB
High risk HPV: NEGATIVE

## 2021-09-10 ENCOUNTER — Encounter: Payer: Self-pay | Admitting: *Deleted

## 2021-09-10 ENCOUNTER — Ambulatory Visit (HOSPITAL_BASED_OUTPATIENT_CLINIC_OR_DEPARTMENT_OTHER): Payer: Medicaid Other | Admitting: Obstetrics and Gynecology

## 2021-09-10 ENCOUNTER — Other Ambulatory Visit: Payer: Self-pay | Admitting: *Deleted

## 2021-09-10 ENCOUNTER — Ambulatory Visit: Payer: Medicaid Other | Attending: Obstetrics & Gynecology

## 2021-09-10 ENCOUNTER — Ambulatory Visit: Payer: Medicaid Other | Admitting: *Deleted

## 2021-09-10 ENCOUNTER — Other Ambulatory Visit: Payer: Self-pay

## 2021-09-10 VITALS — BP 121/72 | HR 81

## 2021-09-10 DIAGNOSIS — O10919 Unspecified pre-existing hypertension complicating pregnancy, unspecified trimester: Secondary | ICD-10-CM

## 2021-09-10 DIAGNOSIS — O10012 Pre-existing essential hypertension complicating pregnancy, second trimester: Secondary | ICD-10-CM

## 2021-09-10 DIAGNOSIS — I10 Essential (primary) hypertension: Secondary | ICD-10-CM | POA: Diagnosis present

## 2021-09-10 DIAGNOSIS — O0990 Supervision of high risk pregnancy, unspecified, unspecified trimester: Secondary | ICD-10-CM | POA: Insufficient documentation

## 2021-09-10 DIAGNOSIS — O99012 Anemia complicating pregnancy, second trimester: Secondary | ICD-10-CM

## 2021-09-10 DIAGNOSIS — Z3A19 19 weeks gestation of pregnancy: Secondary | ICD-10-CM | POA: Diagnosis present

## 2021-09-10 NOTE — Progress Notes (Signed)
Maternal-Fetal Medicine   Name: Sydney Henry DOB: Jan 05, 1991 MRN: [id number] Referring Provider: Scheryl Darter, MD  I had the pleasure of seeing Ms. Lightle today at the [facility]. She is G6 B3419 at 19-weeks' gestation and is here for fetal anatomy scan.  Past medical history significant for chronic hypertension.  Patient reports she has hypertension mainly in pregnancy and not between pregnancies.  She is currently taking nifedipine XL 30 mg daily. She has mild intermittent asthma and uses albuterol as needed. Patient does not have diabetes or any other chronic medical conditions.  She does not have sickle cell trait. Past surgical history: Wisdom tooth surgery Obstetric history 2013: Term vaginal delivery of a female infant weighing 6 pounds 15 ounces at birth. 2019: Term vaginal delivery of a female infant weighing 6 pounds 10 ounces at birth. Both her children are in good health. Medications: Prenatal vitamins, low-dose aspirin, nifedipine, Protonix. Allergies: No known drug allergies. Social history: Quit smoking in pregnancy.  No history of alcohol or drug use.  Her partner is [place] American who is also the father of her second child.  Blood pressure today at her office is 121/72 mmHg.  Ultrasound We performed fetal anatomical survey.  No markers of aneuploidy's or fetal structural defects are seen.  Fetal biometry is consistent with the previously established dates.  Amniotic fluid is normal and good fetal activity seen.  Chronic hypertension -Adverse outcomes of severe chronic hypertension include maternal stroke, endorgan damage, coagulation disturbances and placental abruption.  Superimposed preeclampsia occurs in 30% of women with chronic hypertension.  I discussed the benefit of low-dose aspirin prophylaxis that helps delaying or preventing preeclampsia. -I discussed the safety profile of antihypertensives.  Nifedipine and labetalol can be safely given in  pregnancy.   -I discussed ultrasound protocol of monitoring fetal growth assessment and antenatal testing. -Timing of delivery: Provided her blood pressures are well controlled, she can be delivered at 39 weeks' gestation.  Early term delivery is an option if hypertension is not well controlled. I encouraged her to continue taking low-dose aspirin that delays or prevents preeclampsia.  Recommendations -An appointment was made for her to return in 4 weeks for fetal growth assessment. -Fetal growth assessments every 4 weeks. -Weekly BPP from [redacted] weeks gestation till delivery. -Continue nifedipine and low-dose aspirin.  Thank you for consultation.  If you have any questions or concerns, please contact me the [facility].  Consultation including face-to-face (more than 50%) counseling 30 minutes.

## 2021-09-13 ENCOUNTER — Encounter: Payer: Medicaid Other | Admitting: Obstetrics and Gynecology

## 2021-09-27 ENCOUNTER — Telehealth: Payer: Medicaid Other | Admitting: Obstetrics

## 2021-10-09 ENCOUNTER — Ambulatory Visit: Payer: Medicaid Other

## 2021-10-11 ENCOUNTER — Encounter: Payer: Self-pay | Admitting: Obstetrics

## 2021-10-11 ENCOUNTER — Other Ambulatory Visit: Payer: Self-pay | Admitting: *Deleted

## 2021-10-11 ENCOUNTER — Ambulatory Visit: Payer: Medicaid Other | Admitting: *Deleted

## 2021-10-11 ENCOUNTER — Ambulatory Visit: Payer: Medicaid Other | Attending: Obstetrics and Gynecology

## 2021-10-11 ENCOUNTER — Other Ambulatory Visit: Payer: Self-pay

## 2021-10-11 ENCOUNTER — Telehealth (INDEPENDENT_AMBULATORY_CARE_PROVIDER_SITE_OTHER): Payer: Medicaid Other | Admitting: Obstetrics

## 2021-10-11 VITALS — BP 129/67 | HR 65

## 2021-10-11 DIAGNOSIS — O0990 Supervision of high risk pregnancy, unspecified, unspecified trimester: Secondary | ICD-10-CM | POA: Diagnosis present

## 2021-10-11 DIAGNOSIS — O10919 Unspecified pre-existing hypertension complicating pregnancy, unspecified trimester: Secondary | ICD-10-CM | POA: Diagnosis not present

## 2021-10-11 DIAGNOSIS — Z3689 Encounter for other specified antenatal screening: Secondary | ICD-10-CM

## 2021-10-11 DIAGNOSIS — O09299 Supervision of pregnancy with other poor reproductive or obstetric history, unspecified trimester: Secondary | ICD-10-CM

## 2021-10-11 DIAGNOSIS — O10012 Pre-existing essential hypertension complicating pregnancy, second trimester: Secondary | ICD-10-CM

## 2021-10-11 DIAGNOSIS — Z3A23 23 weeks gestation of pregnancy: Secondary | ICD-10-CM | POA: Diagnosis not present

## 2021-10-11 DIAGNOSIS — O10912 Unspecified pre-existing hypertension complicating pregnancy, second trimester: Secondary | ICD-10-CM

## 2021-10-11 DIAGNOSIS — O99012 Anemia complicating pregnancy, second trimester: Secondary | ICD-10-CM | POA: Diagnosis present

## 2021-10-11 DIAGNOSIS — O0992 Supervision of high risk pregnancy, unspecified, second trimester: Secondary | ICD-10-CM

## 2021-10-11 NOTE — Progress Notes (Signed)
Subjective:  Sydney Henry is a 31 y.o. [id number] at [redacted]w[redacted]d being seen today for ongoing prenatal care.  She is currently monitored for the following issues for this low-risk pregnancy and has BMI 30s; Trichomonal vaginitis during pregnancy; Pelvic pain affecting pregnancy; Chronic hypertension; and Supervision of high risk pregnancy, antepartum on their problem list.  Patient reports heartburn.  Contractions: Not present. Vag. Bleeding: None.  Movement: Present. Denies leaking of fluid.   The following portions of the patient's history were reviewed and updated as appropriate: allergies, current medications, past family history, past medical history, past social history, past surgical history and problem list. Problem list updated.  Objective:  There were no vitals filed for this visit.  Fetal Status:     Movement: Present     General:  Alert, oriented and cooperative. Patient is in no acute distress.  Skin: Skin is warm and dry. No rash noted.   Cardiovascular: Normal heart rate noted  Respiratory: Normal respiratory effort, no problems with respiration noted  Abdomen: Soft, gravid, appropriate for gestational age. Pain/Pressure: Present     Pelvic:  Cervical exam deferred        Extremities: Normal range of motion.  Edema: Trace  Mental Status: Normal mood and affect. Normal behavior. Normal judgment and thought content.   Urinalysis:      Assessment and Plan:  Pregnancy: [id number] at [redacted]w[redacted]d  1. Supervision of high risk pregnancy, antepartum   Preterm labor symptoms and general obstetric precautions including but not limited to vaginal bleeding, contractions, leaking of fluid and fetal movement were reviewed in detail with the patient. Please refer to After Visit Summary for other counseling recommendations.   Return in about 4 weeks (around 11/08/2021) for ROB, 2 hour OGTT.   Shelly Bombard, MD  10/11/21

## 2021-10-11 NOTE — Progress Notes (Signed)
MyChart visit ROB 23.[redacted] wks GA Reports some pelvic pressure and increased vaginal discharge. [place] MFM just prior to this visit: AFI normal, no acute concerns, measuring 5 days and 70th percentile, AFI normal per pt.

## 2021-10-30 ENCOUNTER — Encounter (HOSPITAL_COMMUNITY): Payer: Self-pay | Admitting: Obstetrics and Gynecology

## 2021-10-30 ENCOUNTER — Other Ambulatory Visit: Payer: Self-pay

## 2021-10-30 ENCOUNTER — Telehealth: Payer: Self-pay

## 2021-10-30 ENCOUNTER — Inpatient Hospital Stay (HOSPITAL_COMMUNITY)
Admission: AD | Admit: 2021-10-30 | Discharge: 2021-10-30 | Disposition: A | Discharge status: Home / Self Care | Payer: Medicaid Other | Attending: Obstetrics and Gynecology | Admitting: Obstetrics and Gynecology

## 2021-10-30 DIAGNOSIS — O10912 Unspecified pre-existing hypertension complicating pregnancy, second trimester: Secondary | ICD-10-CM | POA: Diagnosis not present

## 2021-10-30 DIAGNOSIS — O98512 Other viral diseases complicating pregnancy, second trimester: Secondary | ICD-10-CM | POA: Diagnosis not present

## 2021-10-30 DIAGNOSIS — O26892 Other specified pregnancy related conditions, second trimester: Secondary | ICD-10-CM | POA: Diagnosis not present

## 2021-10-30 DIAGNOSIS — B9689 Other specified bacterial agents as the cause of diseases classified elsewhere: Secondary | ICD-10-CM | POA: Diagnosis not present

## 2021-10-30 DIAGNOSIS — B349 Viral infection, unspecified: Secondary | ICD-10-CM | POA: Insufficient documentation

## 2021-10-30 DIAGNOSIS — Z3A26 26 weeks gestation of pregnancy: Secondary | ICD-10-CM | POA: Insufficient documentation

## 2021-10-30 DIAGNOSIS — N760 Acute vaginitis: Secondary | ICD-10-CM

## 2021-10-30 DIAGNOSIS — O23592 Infection of other part of genital tract in pregnancy, second trimester: Secondary | ICD-10-CM | POA: Insufficient documentation

## 2021-10-30 DIAGNOSIS — R102 Pelvic and perineal pain: Secondary | ICD-10-CM | POA: Diagnosis not present

## 2021-10-30 DIAGNOSIS — Z20822 Contact with and (suspected) exposure to covid-19: Secondary | ICD-10-CM | POA: Insufficient documentation

## 2021-10-30 LAB — WET PREP, GENITAL
Sperm: NONE SEEN
Trich, Wet Prep: NONE SEEN
WBC, Wet Prep HPF POC: >=10 — AB (ref <10)
Yeast Wet Prep HPF POC: NONE SEEN

## 2021-10-30 LAB — URINALYSIS, ROUTINE W REFLEX MICROSCOPIC
Bilirubin Urine: NEGATIVE
Glucose, UA: NEGATIVE mg/dL
Hgb urine dipstick: NEGATIVE
Ketones, ur: NEGATIVE mg/dL
Nitrite: NEGATIVE
Protein, ur: NEGATIVE mg/dL
Specific Gravity, Urine: 1.014 (ref 1.005–1.030)
pH: 6 (ref 5.0–8.0)

## 2021-10-30 LAB — RESP PANEL BY RT-PCR (FLU A&B, COVID) ARPGX2
Influenza A by PCR: NEGATIVE
Influenza B by PCR: NEGATIVE
SARS Coronavirus 2 by RT PCR: NEGATIVE

## 2021-10-30 LAB — POCT FERN TEST: POCT Fern Test: NEGATIVE

## 2021-10-30 MED ORDER — METRONIDAZOLE 0.75 % VA GEL: 1.0000 | Freq: Every day | VAGINAL | 1 refills | Status: DC

## 2021-10-30 MED ORDER — CYCLOBENZAPRINE HCL 5 MG PO TABS
5.0000 mg | ORAL_TABLET | Freq: Once | ORAL | Status: AC
  Administered 2021-10-30: 5 mg via ORAL
  Filled 2021-10-30: qty 1

## 2021-10-30 MED ORDER — CYCLOBENZAPRINE HCL 10 MG PO TABS: 10.0000 mg | ORAL_TABLET | Freq: Two times a day (BID) | ORAL | 0 refills | Status: DC | PRN

## 2021-10-30 MED ORDER — GUAIFENESIN ER 600 MG PO TB12
600.0000 mg | ORAL_TABLET | Freq: Once | ORAL | Status: AC
  Administered 2021-10-30: 600 mg via ORAL
  Filled 2021-10-30: qty 1
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
## 2021-10-30 NOTE — Discharge Instructions (Signed)

## 2021-10-30 NOTE — MAU Note (Signed)
Pt reports pelvic pain and vaginal pressure for the last 2 weeks.  ? ?Denies vaginal bleeding.  ? ?Pt reports increase in watery discharge. Pt reports having to change her underwear throughout the day.  ? ?Pt reports runny nose, head congestion, cough, and sinus pressure for 2 days. ?

## 2021-10-30 NOTE — Telephone Encounter (Signed)
Returned call, pt stated that she has been having cold symptoms and wanted to know what was safe to take, advised of OTC meds, patient also stated that she recently started having a lot of vagina pressure and pain rating 10/10. Pt reports fetal movement, advised to be evaluated at hospital, pt agreed. ?

## 2021-10-31 LAB — GC/CHLAMYDIA PROBE AMP (~~LOC~~) NOT AT ARMC
Chlamydia: NEGATIVE
Comment: NEGATIVE
Comment: NORMAL
Neisseria Gonorrhea: NEGATIVE

## 2021-11-08 ENCOUNTER — Other Ambulatory Visit: Payer: Self-pay | Admitting: *Deleted

## 2021-11-08 ENCOUNTER — Encounter: Payer: Self-pay | Admitting: *Deleted

## 2021-11-08 ENCOUNTER — Ambulatory Visit: Payer: Medicaid Other | Admitting: *Deleted

## 2021-11-08 ENCOUNTER — Ambulatory Visit: Payer: Medicaid Other | Attending: Obstetrics

## 2021-11-08 ENCOUNTER — Other Ambulatory Visit: Payer: Self-pay

## 2021-11-08 VITALS — BP 138/88 | HR 77

## 2021-11-08 DIAGNOSIS — O10912 Unspecified pre-existing hypertension complicating pregnancy, second trimester: Secondary | ICD-10-CM

## 2021-11-08 DIAGNOSIS — O09292 Supervision of pregnancy with other poor reproductive or obstetric history, second trimester: Secondary | ICD-10-CM

## 2021-11-08 DIAGNOSIS — Z3A27 27 weeks gestation of pregnancy: Secondary | ICD-10-CM

## 2021-11-08 DIAGNOSIS — O10012 Pre-existing essential hypertension complicating pregnancy, second trimester: Secondary | ICD-10-CM

## 2021-11-08 DIAGNOSIS — O09299 Supervision of pregnancy with other poor reproductive or obstetric history, unspecified trimester: Secondary | ICD-10-CM | POA: Diagnosis present

## 2021-11-08 DIAGNOSIS — D649 Anemia, unspecified: Secondary | ICD-10-CM

## 2021-11-08 DIAGNOSIS — O0990 Supervision of high risk pregnancy, unspecified, unspecified trimester: Secondary | ICD-10-CM

## 2021-11-08 DIAGNOSIS — Z3689 Encounter for other specified antenatal screening: Secondary | ICD-10-CM | POA: Diagnosis not present

## 2021-11-08 DIAGNOSIS — O99012 Anemia complicating pregnancy, second trimester: Secondary | ICD-10-CM

## 2021-11-11 ENCOUNTER — Other Ambulatory Visit: Payer: Medicaid Other

## 2021-11-11 ENCOUNTER — Ambulatory Visit (INDEPENDENT_AMBULATORY_CARE_PROVIDER_SITE_OTHER): Payer: Medicaid Other | Admitting: Licensed Clinical Social Worker

## 2021-11-11 ENCOUNTER — Other Ambulatory Visit: Payer: Self-pay

## 2021-11-11 ENCOUNTER — Ambulatory Visit (INDEPENDENT_AMBULATORY_CARE_PROVIDER_SITE_OTHER): Payer: Medicaid Other | Admitting: Advanced Practice Midwife

## 2021-11-11 VITALS — BP 121/78 | HR 68 | Wt 213.0 lb

## 2021-11-11 DIAGNOSIS — I10 Essential (primary) hypertension: Secondary | ICD-10-CM

## 2021-11-11 DIAGNOSIS — F4321 Adjustment disorder with depressed mood: Secondary | ICD-10-CM | POA: Diagnosis not present

## 2021-11-11 DIAGNOSIS — O0990 Supervision of high risk pregnancy, unspecified, unspecified trimester: Secondary | ICD-10-CM

## 2021-11-11 DIAGNOSIS — Z3A27 27 weeks gestation of pregnancy: Secondary | ICD-10-CM

## 2021-11-11 DIAGNOSIS — R102 Pelvic and perineal pain: Secondary | ICD-10-CM

## 2021-11-11 DIAGNOSIS — O99013 Anemia complicating pregnancy, third trimester: Secondary | ICD-10-CM

## 2021-11-11 DIAGNOSIS — O26899 Other specified pregnancy related conditions, unspecified trimester: Secondary | ICD-10-CM

## 2021-11-11 DIAGNOSIS — G479 Sleep disorder, unspecified: Secondary | ICD-10-CM

## 2021-11-11 DIAGNOSIS — Z3009 Encounter for other general counseling and advice on contraception: Secondary | ICD-10-CM | POA: Insufficient documentation

## 2021-11-11 NOTE — Progress Notes (Signed)
ROB/GTT, declined TDAP and FLU vaccines today.  C/o pressure and pelvic pain. ?BTL signed today. ?

## 2021-11-11 NOTE — Progress Notes (Signed)
? ?  PRENATAL VISIT NOTE ? ?Subjective:  ?Sydney Henry is a 31 y.o. [id number] at [redacted]w[redacted]d being seen today for ongoing prenatal care.  She is currently monitored for the following issues for this high-risk pregnancy and has BMI 30s; Trichomonal vaginitis during pregnancy; Pelvic pain affecting pregnancy; Chronic hypertension; and Supervision of high risk pregnancy, antepartum on their problem list. ? ?Patient reports  intermittent lower abdominal pain with sitting too long ,certain positions .  Contractions: Not present. Vag. Bleeding: None.  Movement: Present. Denies leaking of fluid.  ? ?The following portions of the patient's history were reviewed and updated as appropriate: allergies, current medications, past family history, past medical history, past social history, past surgical history and problem list.  ? ?Objective:  ? ?Vitals:  ? 11/11/21 0828  ?BP: 121/78  ?Pulse: 68  ?Weight: 213 lb (96.6 kg)  ? ? ?Fetal Status: Fetal Heart Rate (bpm): 152   Movement: Present    ? ?General:  Alert, oriented and cooperative. Patient is in no acute distress.  ?Skin: Skin is warm and dry. No rash noted.   ?Cardiovascular: Normal heart rate noted  ?Respiratory: Normal respiratory effort, no problems with respiration noted  ?Abdomen: Soft, gravid, appropriate for gestational age.  Pain/Pressure: Present     ?Pelvic: Cervical exam deferred        ?Extremities: Normal range of motion.  Edema: None  ?Mental Status: Normal mood and affect. Normal behavior. Normal judgment and thought content.  ? ?Assessment and Plan:  ?Pregnancy: [id number] at [redacted]w[redacted]d ?1. Supervision of high risk pregnancy, antepartum ?--Anticipatory guidance about next visits/weeks of pregnancy given.  ?--Next appt in 2 weeks ? ?- Glucose Tolerance, 2 Hours w/1 Hour ?- CBC ?- RPR ?- HIV antibody (with reflex) ? ?2. Chronic hypertension ?--On Procardia, normotensive today ?--Discussed antenatal testing, IOL with pt. MFM discussed IOL at 36-38 weeks. Pt prefers to  schedule at 37 weeks, and has hx severe range BPs closer to delivery so this is reasonable.  Pt needs to plan for work so 37 weeks is 01/14/22.  Will schedule this as soon as possible. Pt aware than antenatal testing or BP elevation could warrant earlier delivery.   ? ?3. [redacted] weeks gestation of pregnancy ? ? ?4. Pain of round ligament affecting pregnancy, antepartum ?--Rest/ice/heat/warm bath/increase PO fluids/Tylenol/pregnancy support belt   ? ?5. Sleep disturbance ?--Discussed good sleep hygiene, naps when possible, Benadryl PRN use.  ? ?Preterm labor symptoms and general obstetric precautions including but not limited to vaginal bleeding, contractions, leaking of fluid and fetal movement were reviewed in detail with the patient. ?Please refer to After Visit Summary for other counseling recommendations.  ? ?Return in about 2 weeks (around 11/25/2021) for Any provider. ? ?Future Appointments  ?Date Time Provider Department Center  ?12/05/2021  7:15 AM [facility] NURSE [facility]  ?12/05/2021  7:30 AM [facility] US3 [facility]  ?12/12/2021  7:15 AM [facility] NURSE [facility]  ?12/12/2021  7:30 AM [facility] US3 [facility]  ?12/19/2021  7:15 AM [facility] NURSE [facility]  ?12/19/2021  7:30 AM [facility] US2 [facility]  ? ? ?Sharen Counter, CNM  ?

## 2021-11-12 LAB — CBC
Hematocrit: 30 % — ABNORMAL LOW (ref 34.0–46.6)
Hemoglobin: 10.1 g/dL — ABNORMAL LOW (ref 11.1–15.9)
MCH: 30.9 pg (ref 26.6–33.0)
MCHC: 33.7 g/dL (ref 31.5–35.7)
MCV: 92 fL (ref 79–97)
Platelets: 182 10*3/uL (ref 150–450)
RBC: 3.27 x10E6/uL — ABNORMAL LOW (ref 3.77–5.28)
RDW: 12.3 % (ref 11.7–15.4)
WBC: 7.2 10*3/uL (ref 3.4–10.8)

## 2021-11-12 LAB — GLUCOSE TOLERANCE, 2 HOURS W/ 1HR
Glucose, 1 hour: 101 mg/dL (ref 70–179)
Glucose, 2 hour: 89 mg/dL (ref 70–152)
Glucose, Fasting: 69 mg/dL — ABNORMAL LOW (ref 70–91)

## 2021-11-12 LAB — RPR: RPR Ser Ql: NONREACTIVE

## 2021-11-12 LAB — HIV ANTIBODY (ROUTINE TESTING W REFLEX): HIV Screen 4th Generation wRfx: NONREACTIVE

## 2021-11-12 NOTE — BH Specialist Note (Signed)
Integrated Behavioral Health Initial In-Person Visit ? ?MRN: [id number] ?Name: Sydney Henry ? ?Number of [place] Clinician visits: 1 ?Session Start time:   935am ?Session End time: 10:08am ?Total time in minutes: 33 mins in person at [facility]  ? ?Types of Service: Individual psychotherapy ? ?Interpretor:No. Interpretor Name and Language: none ? ? Warm Hand Off Completed. ?  ? ?  ? ? ?Subjective: ?Sydney Henry is a 31 y.o. female accompanied by n/a ?Patient was referred by L Amedeo Gory for positive depression screen. ?Patient reports the following symptoms/concerns: depressed mood, difficulty sleeping, trouble focusing, worry, negative self image  ?Duration of problem: approx 3 months ; Severity of problem: mild ? ?Objective: ?Mood: Depressed and Affect: Depressed ?Risk of harm to self or others: No plan to harm self or others ? ?Life Context: ?Family and Social: Lives with children in [place]  ?School/Work: n/a ?Self-Care: n/a ?Life Changes: New pregnancy ? ?Patient and/or Family's Strengths/Protective Factors: ?Concrete supports in place (healthy food, safe environments, etc.) ? ?Goals Addressed: ?Patient will: ?Reduce symptoms of: depression ?Increase knowledge and/or ability of: coping skills  ?Demonstrate ability to: Increase adequate support systems for patient/family ? ?Progress towards Goals: ?Ongoing ? ?Interventions: ?Interventions utilized: Supportive Counseling  ?Standardized Assessments completed: PHQ 9 ? ?Patient and/or Family Response: Ms. Jurkiewicz responded well to visit  ? ? ?Assessment: ?Patient currently experiencing adjustment disorder with depressed mood. ?  ?Patient may benefit from integrated behavioral health. ? ?Plan: ?Follow up with behavioral health clinician on : 11/25/2021 ?Behavioral recommendations: Prioritize rest, begin journal writing, communicate needs with family for added support ?Referral(s): [place] (In Clinic) ? ?Lynnea Ferrier, LCSW ? ? ? ? ? ? ? ? ?

## 2021-11-13 MED ORDER — FERROUS SULFATE 325 (65 FE) MG PO TABS: 325.0000 mg | ORAL_TABLET | ORAL | 5 refills | Status: DC

## 2021-11-13 NOTE — Addendum Note (Signed)
Addended by: Fatima Blank A on: 11/13/2021 09:11 PM ? ? Modules accepted: Orders ? ?

## 2021-11-25 ENCOUNTER — Ambulatory Visit: Payer: Medicaid Other | Admitting: Licensed Clinical Social Worker

## 2021-11-25 ENCOUNTER — Encounter: Payer: Self-pay | Admitting: Advanced Practice Midwife

## 2021-11-25 ENCOUNTER — Ambulatory Visit (INDEPENDENT_AMBULATORY_CARE_PROVIDER_SITE_OTHER): Payer: Medicaid Other | Admitting: Advanced Practice Midwife

## 2021-11-25 VITALS — BP 131/80 | HR 71 | Wt 212.0 lb

## 2021-11-25 DIAGNOSIS — Z3A29 29 weeks gestation of pregnancy: Secondary | ICD-10-CM

## 2021-11-25 DIAGNOSIS — O26893 Other specified pregnancy related conditions, third trimester: Secondary | ICD-10-CM

## 2021-11-25 DIAGNOSIS — O0990 Supervision of high risk pregnancy, unspecified, unspecified trimester: Secondary | ICD-10-CM

## 2021-11-25 DIAGNOSIS — F4321 Adjustment disorder with depressed mood: Secondary | ICD-10-CM

## 2021-11-25 DIAGNOSIS — R102 Pelvic and perineal pain: Secondary | ICD-10-CM

## 2021-11-25 DIAGNOSIS — I10 Essential (primary) hypertension: Secondary | ICD-10-CM

## 2021-11-25 NOTE — Progress Notes (Signed)
Pt presents for ROB without complaints.  
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
## 2021-11-27 ENCOUNTER — Encounter: Payer: Self-pay | Admitting: Advanced Practice Midwife

## 2021-12-05 ENCOUNTER — Ambulatory Visit: Payer: Medicaid Other | Attending: Obstetrics and Gynecology

## 2021-12-05 ENCOUNTER — Ambulatory Visit: Payer: Medicaid Other | Admitting: *Deleted

## 2021-12-05 VITALS — BP 122/78 | HR 80

## 2021-12-05 DIAGNOSIS — O99013 Anemia complicating pregnancy, third trimester: Secondary | ICD-10-CM | POA: Diagnosis not present

## 2021-12-05 DIAGNOSIS — O10912 Unspecified pre-existing hypertension complicating pregnancy, second trimester: Secondary | ICD-10-CM | POA: Insufficient documentation

## 2021-12-05 DIAGNOSIS — O0990 Supervision of high risk pregnancy, unspecified, unspecified trimester: Secondary | ICD-10-CM

## 2021-12-05 DIAGNOSIS — O10013 Pre-existing essential hypertension complicating pregnancy, third trimester: Secondary | ICD-10-CM

## 2021-12-05 DIAGNOSIS — D649 Anemia, unspecified: Secondary | ICD-10-CM | POA: Diagnosis not present

## 2021-12-05 DIAGNOSIS — Z3009 Encounter for other general counseling and advice on contraception: Secondary | ICD-10-CM | POA: Diagnosis present

## 2021-12-05 DIAGNOSIS — O99513 Diseases of the respiratory system complicating pregnancy, third trimester: Secondary | ICD-10-CM

## 2021-12-05 DIAGNOSIS — O09293 Supervision of pregnancy with other poor reproductive or obstetric history, third trimester: Secondary | ICD-10-CM | POA: Diagnosis not present

## 2021-12-05 DIAGNOSIS — J45909 Unspecified asthma, uncomplicated: Secondary | ICD-10-CM

## 2021-12-05 DIAGNOSIS — Z3A31 31 weeks gestation of pregnancy: Secondary | ICD-10-CM

## 2021-12-05 DIAGNOSIS — O09299 Supervision of pregnancy with other poor reproductive or obstetric history, unspecified trimester: Secondary | ICD-10-CM | POA: Diagnosis present

## 2021-12-12 ENCOUNTER — Other Ambulatory Visit: Payer: Self-pay | Admitting: Obstetrics and Gynecology

## 2021-12-12 ENCOUNTER — Ambulatory Visit: Payer: Medicaid Other | Attending: Obstetrics and Gynecology

## 2021-12-12 ENCOUNTER — Encounter: Payer: Self-pay | Admitting: *Deleted

## 2021-12-12 ENCOUNTER — Other Ambulatory Visit: Payer: Self-pay | Admitting: *Deleted

## 2021-12-12 ENCOUNTER — Ambulatory Visit: Payer: Medicaid Other | Admitting: *Deleted

## 2021-12-12 VITALS — BP 135/84 | HR 75

## 2021-12-12 DIAGNOSIS — O10013 Pre-existing essential hypertension complicating pregnancy, third trimester: Secondary | ICD-10-CM

## 2021-12-12 DIAGNOSIS — O10919 Unspecified pre-existing hypertension complicating pregnancy, unspecified trimester: Secondary | ICD-10-CM

## 2021-12-12 DIAGNOSIS — D649 Anemia, unspecified: Secondary | ICD-10-CM | POA: Diagnosis not present

## 2021-12-12 DIAGNOSIS — O10913 Unspecified pre-existing hypertension complicating pregnancy, third trimester: Secondary | ICD-10-CM

## 2021-12-12 DIAGNOSIS — O0990 Supervision of high risk pregnancy, unspecified, unspecified trimester: Secondary | ICD-10-CM

## 2021-12-12 DIAGNOSIS — O10912 Unspecified pre-existing hypertension complicating pregnancy, second trimester: Secondary | ICD-10-CM

## 2021-12-12 DIAGNOSIS — O09299 Supervision of pregnancy with other poor reproductive or obstetric history, unspecified trimester: Secondary | ICD-10-CM | POA: Diagnosis present

## 2021-12-12 DIAGNOSIS — O99013 Anemia complicating pregnancy, third trimester: Secondary | ICD-10-CM

## 2021-12-12 DIAGNOSIS — Z3009 Encounter for other general counseling and advice on contraception: Secondary | ICD-10-CM | POA: Diagnosis present

## 2021-12-12 DIAGNOSIS — O09293 Supervision of pregnancy with other poor reproductive or obstetric history, third trimester: Secondary | ICD-10-CM

## 2021-12-12 DIAGNOSIS — J45909 Unspecified asthma, uncomplicated: Secondary | ICD-10-CM

## 2021-12-12 DIAGNOSIS — O99513 Diseases of the respiratory system complicating pregnancy, third trimester: Secondary | ICD-10-CM

## 2021-12-12 DIAGNOSIS — Z3A32 32 weeks gestation of pregnancy: Secondary | ICD-10-CM

## 2021-12-12 NOTE — Procedures (Signed)
Sydney Henry ?1991/02/12 ?[redacted]w[redacted]d ? ?Fetus A Non-Stress Test Interpretation for 12/12/21 ? ?Indication: Unsatisfactory BPP ? ?Fetal Heart Rate A ?Mode: External ?Baseline Rate (A): 150 bpm ?Variability: Moderate ?Accelerations: 15 x 15, 10 x 10 (only 1 15x15 during NST) ?Decelerations: Variable ?Multiple birth?: No ? ?Uterine Activity ?Mode: Toco ?Contraction Frequency (min): UI ?Contraction Quality: Mild ?Resting Tone Palpated: Relaxed ? ?Interpretation (Fetal Testing) ?Nonstress Test Interpretation: Non-reactive ?Overall Impression: Reassuring for gestational age ?Comments: tracing reviewed by Dr. Donalee Citrin ? ? ?

## 2021-12-13 ENCOUNTER — Ambulatory Visit: Payer: Medicaid Other | Admitting: *Deleted

## 2021-12-13 ENCOUNTER — Ambulatory Visit: Payer: Medicaid Other | Attending: Obstetrics and Gynecology | Admitting: *Deleted

## 2021-12-13 VITALS — BP 120/74 | HR 78

## 2021-12-13 DIAGNOSIS — Z3A32 32 weeks gestation of pregnancy: Secondary | ICD-10-CM | POA: Diagnosis not present

## 2021-12-13 DIAGNOSIS — O163 Unspecified maternal hypertension, third trimester: Secondary | ICD-10-CM | POA: Diagnosis present

## 2021-12-13 DIAGNOSIS — O10919 Unspecified pre-existing hypertension complicating pregnancy, unspecified trimester: Secondary | ICD-10-CM | POA: Diagnosis not present

## 2021-12-13 DIAGNOSIS — Z3009 Encounter for other general counseling and advice on contraception: Secondary | ICD-10-CM

## 2021-12-13 DIAGNOSIS — O0990 Supervision of high risk pregnancy, unspecified, unspecified trimester: Secondary | ICD-10-CM

## 2021-12-13 NOTE — Procedures (Signed)
Sydney Henry ?1990-11-02 ?[redacted]w[redacted]d ? ?Fetus A Non-Stress Test Interpretation for 12/13/21 ? ?Indication: Chronic Hypertenstion ? ?Fetal Heart Rate A ?Mode: External ?Baseline Rate (A): 150 bpm ?Variability: Moderate ?Accelerations: 10 x 10, 15 x 15 ?Decelerations: None ?Multiple birth?: No ? ?Uterine Activity ?Mode: Palpation, Toco ?Contraction Frequency (min): None ? ?Interpretation (Fetal Testing) ?Nonstress Test Interpretation: Reactive ?Overall Impression: Reassuring for gestational age ?Comments: Tracing reviewed by Dr. Grace Bushy ? ? ?

## 2021-12-16 ENCOUNTER — Encounter: Payer: Self-pay | Admitting: Medical

## 2021-12-16 ENCOUNTER — Ambulatory Visit (INDEPENDENT_AMBULATORY_CARE_PROVIDER_SITE_OTHER): Payer: Medicaid Other | Admitting: Medical

## 2021-12-16 VITALS — BP 139/88 | HR 69 | Wt 217.0 lb

## 2021-12-16 DIAGNOSIS — Z3A32 32 weeks gestation of pregnancy: Secondary | ICD-10-CM

## 2021-12-16 DIAGNOSIS — Z3009 Encounter for other general counseling and advice on contraception: Secondary | ICD-10-CM

## 2021-12-16 DIAGNOSIS — E669 Obesity, unspecified: Secondary | ICD-10-CM

## 2021-12-16 DIAGNOSIS — O99013 Anemia complicating pregnancy, third trimester: Secondary | ICD-10-CM

## 2021-12-16 DIAGNOSIS — O0990 Supervision of high risk pregnancy, unspecified, unspecified trimester: Secondary | ICD-10-CM

## 2021-12-16 DIAGNOSIS — I10 Essential (primary) hypertension: Secondary | ICD-10-CM

## 2021-12-16 NOTE — Progress Notes (Signed)
? ?  PRENATAL VISIT NOTE ? ?Subjective:  ?Sydney Henry is a 31 y.o. [id number] at [redacted]w[redacted]d being seen today for ongoing prenatal care.  She is currently monitored for the following issues for this high-risk pregnancy and has Anemia affecting pregnancy in third trimester; BMI 30s; Pelvic pain affecting pregnancy; Chronic hypertension; Supervision of high risk pregnancy, antepartum; and Unwanted fertility on their problem list. ? ?Patient reports occasional contractions.  Contractions: Irregular. Vag. Bleeding: None.  Movement: Present. Denies leaking of fluid.  ? ?The following portions of the patient's history were reviewed and updated as appropriate: allergies, current medications, past family history, past medical history, past social history, past surgical history and problem list.  ? ?Objective:  ? ?Vitals:  ? 12/16/21 1608  ?BP: 139/88  ?Pulse: 69  ?Weight: 217 lb (98.4 kg)  ? ? ?Fetal Status: Fetal Heart Rate (bpm): 150   Movement: Present    ? ?General:  Alert, oriented and cooperative. Patient is in no acute distress.  ?Skin: Skin is warm and dry. No rash noted.   ?Cardiovascular: Normal heart rate noted  ?Respiratory: Normal respiratory effort, no problems with respiration noted  ?Abdomen: Soft, gravid, appropriate for gestational age.  Pain/Pressure: Present     ?Pelvic: Cervical exam deferred        ?Extremities: Normal range of motion.  Edema: Trace  ?Mental Status: Normal mood and affect. Normal behavior. Normal judgment and thought content.  ? ?Assessment and Plan:  ?Pregnancy: [id number] at [redacted]w[redacted]d ?1. Supervision of high risk pregnancy, antepartum ?- BTL signed previously ?- Patient very concerned that MFM has been telling her she may not deliver at 37 weeks, she does want to be delivered at 37 weeks due to blood pressure, history of worsening BP in pregnancy and pain ?- I will schedule IOL for 37 weeks as this is within the acceptable range for [facility] on meds especially in a [id number] with 2 previous vaginal  deliveries  ? ?2. Chronic hypertension ?- Well-controlled on medications ?- Reports one elevated BP at home, but recheck was normal ?- Knows signs and symptoms of worsening HTN and pre-eclampsia ?- BASA  ? ?3. Unwanted fertility ?- Previously signed BTL ? ?4. BMI 30s ?- BASA ? ?5. Anemia affecting pregnancy in third trimester ?- On PO iron ? ?6. [redacted] weeks gestation of pregnancy ? ?Preterm labor symptoms and general obstetric precautions including but not limited to vaginal bleeding, contractions, leaking of fluid and fetal movement were reviewed in detail with the patient. ?Please refer to After Visit Summary for other counseling recommendations.  ? ?Return in about 2 weeks (around 12/30/2021) for [facility] APP, any provider, In-Person. ? ?Future Appointments  ?Date Time Provider [place]  ?12/19/2021  7:15 AM [facility] NURSE [facility]  ?12/19/2021  7:30 AM [facility] US2 [facility]  ?12/26/2021  7:15 AM [facility] NURSE [facility]  ?12/26/2021  7:30 AM [facility] US2 [facility] [place]  ?12/31/2021  9:35 AM Rasch, Artist Pais, NP [facility] None  ?01/02/2022  7:15 AM [facility] NURSE [facility]  ?01/02/2022  7:30 AM [facility] US3 [facility]  ?01/09/2022  7:15 AM [facility] NURSE [facility]  ?01/09/2022  7:30 AM [facility] US3 [facility]  ? ? ?Kerry Hough, PA-C ? ?

## 2021-12-16 NOTE — Progress Notes (Signed)
ROB, c/o pressure, pain 8/10, fatigue. Pt wants to be induced on 01/14/22. ?

## 2021-12-18 ENCOUNTER — Inpatient Hospital Stay (HOSPITAL_BASED_OUTPATIENT_CLINIC_OR_DEPARTMENT_OTHER): Payer: Medicaid Other

## 2021-12-18 ENCOUNTER — Encounter (HOSPITAL_COMMUNITY): Payer: Self-pay | Admitting: Obstetrics and Gynecology

## 2021-12-18 ENCOUNTER — Other Ambulatory Visit: Payer: Self-pay

## 2021-12-18 ENCOUNTER — Inpatient Hospital Stay (HOSPITAL_COMMUNITY)
Admission: AD | Admit: 2021-12-18 | Discharge: 2021-12-18 | Disposition: A | Discharge status: Home / Self Care | Payer: Medicaid Other | Attending: Obstetrics and Gynecology | Admitting: Obstetrics and Gynecology

## 2021-12-18 DIAGNOSIS — R197 Diarrhea, unspecified: Secondary | ICD-10-CM | POA: Diagnosis not present

## 2021-12-18 DIAGNOSIS — O212 Late vomiting of pregnancy: Secondary | ICD-10-CM | POA: Diagnosis not present

## 2021-12-18 DIAGNOSIS — Z3A33 33 weeks gestation of pregnancy: Secondary | ICD-10-CM | POA: Insufficient documentation

## 2021-12-18 DIAGNOSIS — O4700 False labor before 37 completed weeks of gestation, unspecified trimester: Secondary | ICD-10-CM

## 2021-12-18 DIAGNOSIS — O26893 Other specified pregnancy related conditions, third trimester: Secondary | ICD-10-CM | POA: Diagnosis not present

## 2021-12-18 DIAGNOSIS — R102 Pelvic and perineal pain: Secondary | ICD-10-CM

## 2021-12-18 DIAGNOSIS — R112 Nausea with vomiting, unspecified: Secondary | ICD-10-CM

## 2021-12-18 DIAGNOSIS — O0990 Supervision of high risk pregnancy, unspecified, unspecified trimester: Secondary | ICD-10-CM

## 2021-12-18 DIAGNOSIS — O99891 Other specified diseases and conditions complicating pregnancy: Secondary | ICD-10-CM | POA: Diagnosis present

## 2021-12-18 DIAGNOSIS — Z3009 Encounter for other general counseling and advice on contraception: Secondary | ICD-10-CM

## 2021-12-18 LAB — CBC WITH DIFFERENTIAL/PLATELET
Abs Immature Granulocytes: 0.05 10*3/uL (ref 0.00–0.07)
Basophils Absolute: 0 10*3/uL (ref 0.0–0.1)
Basophils Relative: 0 %
Eosinophils Absolute: 0.2 10*3/uL (ref 0.0–0.5)
Eosinophils Relative: 3 %
HCT: 32.9 % — ABNORMAL LOW (ref 36.0–46.0)
Hemoglobin: 11.2 g/dL — ABNORMAL LOW (ref 12.0–15.0)
Immature Granulocytes: 1 %
Lymphocytes Relative: 31 %
Lymphs Abs: 2.4 10*3/uL (ref 0.7–4.0)
MCH: 29.9 pg (ref 26.0–34.0)
MCHC: 34 g/dL (ref 30.0–36.0)
MCV: 87.7 fL (ref 80.0–100.0)
Monocytes Absolute: 0.8 10*3/uL (ref 0.1–1.0)
Monocytes Relative: 11 %
Neutro Abs: 4.2 10*3/uL (ref 1.7–7.7)
Neutrophils Relative %: 54 %
Platelets: 230 10*3/uL (ref 150–400)
RBC: 3.75 MIL/uL — ABNORMAL LOW (ref 3.87–5.11)
RDW: 12.8 % (ref 11.5–15.5)
WBC: 7.6 10*3/uL (ref 4.0–10.5)
nRBC: 0 % (ref 0.0–0.2)

## 2021-12-18 LAB — URINALYSIS, ROUTINE W REFLEX MICROSCOPIC
Bilirubin Urine: NEGATIVE
Glucose, UA: NEGATIVE mg/dL
Hgb urine dipstick: NEGATIVE
Ketones, ur: 5 mg/dL — AB
Nitrite: NEGATIVE
Protein, ur: 30 mg/dL — AB
Specific Gravity, Urine: 1.024 (ref 1.005–1.030)
pH: 7 (ref 5.0–8.0)

## 2021-12-18 LAB — TYPE AND SCREEN
ABO/RH(D): B POS
Antibody Screen: NEGATIVE

## 2021-12-18 LAB — COMPREHENSIVE METABOLIC PANEL
ALT: 11 U/L (ref 0–44)
AST: 17 U/L (ref 15–41)
Albumin: 3.2 g/dL — ABNORMAL LOW (ref 3.5–5.0)
Alkaline Phosphatase: 71 U/L (ref 38–126)
Anion gap: 10 (ref 5–15)
BUN: 5 mg/dL — ABNORMAL LOW (ref 6–20)
CO2: 18 mmol/L — ABNORMAL LOW (ref 22–32)
Calcium: 9.2 mg/dL (ref 8.9–10.3)
Chloride: 107 mmol/L (ref 98–111)
Creatinine, Ser: 0.56 mg/dL (ref 0.44–1.00)
GFR, Estimated: >60 mL/min (ref >60)
Glucose, Bld: 59 mg/dL — ABNORMAL LOW (ref 70–99)
Potassium: 3.4 mmol/L — ABNORMAL LOW (ref 3.5–5.1)
Sodium: 135 mmol/L (ref 135–145)
Total Bilirubin: 0.3 mg/dL (ref 0.3–1.2)
Total Protein: 6.9 g/dL (ref 6.5–8.1)

## 2021-12-18 LAB — GLUCOSE, CAPILLARY: Glucose-Capillary: 77 mg/dL (ref 70–99)

## 2021-12-18 LAB — PROTEIN / CREATININE RATIO, URINE
Creatinine, Urine: 225.84 mg/dL
Protein Creatinine Ratio: 0.17 mg/mg{Cre} — ABNORMAL HIGH (ref 0.00–0.15)
Total Protein, Urine: 38 mg/dL

## 2021-12-18 LAB — TSH: TSH: 0.832 u[IU]/mL (ref 0.350–4.500)

## 2021-12-18 LAB — LIPASE, BLOOD: Lipase: 46 U/L (ref 11–51)

## 2021-12-18 MED ORDER — NIFEDIPINE 10 MG PO CAPS
10.0000 mg | ORAL_CAPSULE | Freq: Three times a day (TID) | ORAL | Status: DC
  Administered 2021-12-18: 10 mg via ORAL
  Filled 2021-12-18: qty 1

## 2021-12-18 MED ORDER — NIFEDIPINE 10 MG PO CAPS: 10.0000 mg | ORAL_CAPSULE | ORAL | Status: AC

## 2021-12-18 MED ORDER — LACTATED RINGERS IV BOLUS
1000.0000 mL | Freq: Once | INTRAVENOUS | Status: AC
  Administered 2021-12-18: 1000 mL via INTRAVENOUS

## 2021-12-18 MED ORDER — ONDANSETRON HCL 4 MG/2ML IJ SOLN
4.0000 mg | Freq: Once | INTRAMUSCULAR | Status: AC
  Administered 2021-12-18: 4 mg via INTRAVENOUS
  Filled 2021-12-18: qty 2

## 2021-12-18 MED ORDER — TERBUTALINE SULFATE 1 MG/ML IJ SOLN: 0.2500 mg | Freq: Once | INTRAMUSCULAR | Status: AC

## 2021-12-18 MED ORDER — OXYCODONE HCL 5 MG PO TABS: 5.0000 mg | ORAL_TABLET | Freq: Four times a day (QID) | ORAL | Status: DC | PRN

## 2021-12-18 MED ORDER — DICYCLOMINE HCL 10 MG/ML IM SOLN
20.0000 mg | Freq: Once | INTRAMUSCULAR | Status: AC
  Administered 2021-12-18: 20 mg via INTRAMUSCULAR
  Filled 2021-12-18 (×2): qty 2

## 2021-12-18 MED ORDER — NIFEDIPINE 10 MG PO CAPS: 10.0000 mg | ORAL_CAPSULE | Freq: Four times a day (QID) | ORAL | Status: DC

## 2021-12-18 MED ORDER — OXYCODONE HCL 5 MG PO TABS: 5.0000 mg | ORAL_TABLET | Freq: Four times a day (QID) | ORAL | 0 refills | Status: DC | PRN

## 2021-12-18 MED ORDER — NIFEDIPINE 10 MG PO CAPS: 30.0000 mg | ORAL_CAPSULE | Freq: Once | ORAL | Status: DC

## 2021-12-18 MED ORDER — HYDROMORPHONE HCL 1 MG/ML IJ SOLN
1.0000 mg | Freq: Once | INTRAMUSCULAR | Status: AC
  Administered 2021-12-18: 1 mg via INTRAVENOUS
  Filled 2021-12-18: qty 1

## 2021-12-18 MED ORDER — NIFEDIPINE 10 MG PO CAPS: 10.0000 mg | ORAL_CAPSULE | Freq: Three times a day (TID) | ORAL | 0 refills | Status: DC | PRN

## 2021-12-18 MED ORDER — TERBUTALINE SULFATE 1 MG/ML IJ SOLN
INTRAMUSCULAR | Status: AC
  Administered 2021-12-18: 0.25 mg via SUBCUTANEOUS
  Filled 2021-12-18: qty 1
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
## 2021-12-18 NOTE — Discharge Instructions (Signed)
You can take the as needed procardia 10mg  (in addition to your blood pressure Procardia) as long as your blood pressures are above 110/70. ? ?Please seek medical care if you have vaginal bleeding, leaking of fluid, or stop feeling your baby move as much. ?

## 2021-12-18 NOTE — MAU Note (Addendum)
Sydney Henry is a 31 y.o. at [redacted]w[redacted]d here in MAU reporting: sudden onset of n/v, diarrhea and shortness of breath.  Was napping, woke up to use the bathroom about an hour ago, thought she just needed to have regular BM, had diarrhea, and cramping in lower abd. Started throwing up and having the diarrhea, was coming out both ends.  Then started feeling SOB. (Pt becomes very anxious and resp rate increases when she is talking and every time she tells what happened).  Threw up several times on the way here.  When arrived she felt dizzy like she was going to pass out.   ? ?We received a "medical emergency" call to the lobby, pt had been brought in a WC to registration, pt immediately brought to rm, assisted into bed and with clothing change.  Rasch NP present with the transfer to rm. ? ?Onset of complaint: approx 1400 ?Pain score: 8 ?Vitals:  ? 12/18/21 1450  ?BP: (!) 141/97  ?Pulse: (!) 113  ?Resp: (!) 24  ?Temp: 98.2 ?F (36.8 ?C)  ?SpO2: 100%  ?   ?FHT:170 ?Lab orders placed from triage:   ?

## 2021-12-19 ENCOUNTER — Ambulatory Visit: Payer: Medicaid Other

## 2021-12-19 ENCOUNTER — Ambulatory Visit: Payer: Medicaid Other | Attending: Obstetrics and Gynecology

## 2021-12-20 LAB — CULTURE, OB URINE

## 2021-12-21 ENCOUNTER — Other Ambulatory Visit: Payer: Self-pay

## 2021-12-21 ENCOUNTER — Inpatient Hospital Stay (HOSPITAL_COMMUNITY)
Admission: AD | Admit: 2021-12-21 | Discharge: 2021-12-21 | Disposition: A | Discharge status: Home / Self Care | Payer: Medicaid Other | Attending: Obstetrics & Gynecology | Admitting: Obstetrics & Gynecology

## 2021-12-21 ENCOUNTER — Encounter (HOSPITAL_COMMUNITY): Payer: Self-pay | Admitting: Obstetrics & Gynecology

## 2021-12-21 DIAGNOSIS — O26893 Other specified pregnancy related conditions, third trimester: Secondary | ICD-10-CM | POA: Insufficient documentation

## 2021-12-21 DIAGNOSIS — N760 Acute vaginitis: Secondary | ICD-10-CM | POA: Diagnosis not present

## 2021-12-21 DIAGNOSIS — Z3A33 33 weeks gestation of pregnancy: Secondary | ICD-10-CM

## 2021-12-21 DIAGNOSIS — Z3009 Encounter for other general counseling and advice on contraception: Secondary | ICD-10-CM

## 2021-12-21 DIAGNOSIS — Z3689 Encounter for other specified antenatal screening: Secondary | ICD-10-CM

## 2021-12-21 DIAGNOSIS — O0990 Supervision of high risk pregnancy, unspecified, unspecified trimester: Secondary | ICD-10-CM

## 2021-12-21 DIAGNOSIS — O4703 False labor before 37 completed weeks of gestation, third trimester: Secondary | ICD-10-CM | POA: Diagnosis not present

## 2021-12-21 LAB — URINALYSIS, ROUTINE W REFLEX MICROSCOPIC
Bilirubin Urine: NEGATIVE
Glucose, UA: NEGATIVE mg/dL
Hgb urine dipstick: NEGATIVE
Ketones, ur: NEGATIVE mg/dL
Nitrite: NEGATIVE
Protein, ur: NEGATIVE mg/dL
Specific Gravity, Urine: 1.014 (ref 1.005–1.030)
pH: 5 (ref 5.0–8.0)

## 2021-12-21 LAB — WET PREP, GENITAL
Sperm: NONE SEEN
Trich, Wet Prep: NONE SEEN
WBC, Wet Prep HPF POC: >=10 — AB (ref <10)
Yeast Wet Prep HPF POC: NONE SEEN

## 2021-12-21 MED ORDER — TERBUTALINE SULFATE 1 MG/ML IJ SOLN
0.2500 mg | Freq: Once | INTRAMUSCULAR | Status: AC
  Administered 2021-12-21: 0.25 mg via SUBCUTANEOUS
  Filled 2021-12-21: qty 1

## 2021-12-21 MED ORDER — HYDROMORPHONE HCL 1 MG/ML IJ SOLN
1.0000 mg | Freq: Once | INTRAMUSCULAR | Status: AC
  Administered 2021-12-21: 1 mg via INTRAVENOUS
  Filled 2021-12-21: qty 1

## 2021-12-21 MED ORDER — METRONIDAZOLE 0.75 % VA GEL: 1.0000 | Freq: Every day | VAGINAL | 0 refills | Status: DC

## 2021-12-21 MED ORDER — LACTATED RINGERS IV BOLUS
1000.0000 mL | Freq: Once | INTRAVENOUS | Status: AC
Start: 2021-12-21 — End: 2021-12-21
  Administered 2021-12-21: 1000 mL via INTRAVENOUS

## 2021-12-21 MED ORDER — LACTATED RINGERS IV BOLUS
1000.0000 mL | Freq: Once | INTRAVENOUS | Status: AC
  Administered 2021-12-21: 1000 mL via INTRAVENOUS

## 2021-12-21 MED ORDER — NIFEDIPINE 10 MG PO CAPS
10.0000 mg | ORAL_CAPSULE | ORAL | Status: AC | PRN
  Administered 2021-12-21 (×3): 10 mg via ORAL
  Filled 2021-12-21 (×3): qty 1

## 2021-12-21 MED ORDER — METRONIDAZOLE 500 MG PO TABS
500.0000 mg | ORAL_TABLET | Freq: Once | ORAL | Status: DC
  Filled 2021-12-21: qty 1

## 2021-12-21 MED ORDER — MORPHINE SULFATE (PF) 4 MG/ML IV SOLN
4.0000 mg | Freq: Once | INTRAVENOUS | Status: AC
  Administered 2021-12-21: 4 mg via INTRAVENOUS
  Filled 2021-12-21: qty 1

## 2021-12-21 NOTE — MAU Note (Signed)
.  ROSELLEN LICHTENBERGER is a 31 y.o. at [redacted]w[redacted]d here in MAU reporting: contractions since last night. Denies LOF or VB. Good FM ?LMP: [facility] 02/04/22 ?Onset of complaint: last night ?Pain score: 8 ?Vitals:  ? 12/21/21 0105  ?Pulse: 75  ?Resp: 20  ?Temp: 98.8 ?F (37.1 ?C)  ?SpO2: 100%  ?   ?FHT:150 ?Lab orders placed from triage:  u/c ? ?
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
## 2021-12-24 ENCOUNTER — Encounter: Payer: Self-pay | Admitting: Advanced Practice Midwife

## 2021-12-25 ENCOUNTER — Encounter (HOSPITAL_COMMUNITY): Payer: Self-pay | Admitting: Obstetrics & Gynecology

## 2021-12-25 ENCOUNTER — Inpatient Hospital Stay (HOSPITAL_COMMUNITY)
Admission: AD | Admit: 2021-12-25 | Discharge: 2021-12-25 | Disposition: A | Discharge status: Home / Self Care | Payer: Medicaid Other | Attending: Obstetrics & Gynecology | Admitting: Obstetrics & Gynecology

## 2021-12-25 DIAGNOSIS — O2343 Unspecified infection of urinary tract in pregnancy, third trimester: Secondary | ICD-10-CM | POA: Insufficient documentation

## 2021-12-25 DIAGNOSIS — O4703 False labor before 37 completed weeks of gestation, third trimester: Secondary | ICD-10-CM

## 2021-12-25 DIAGNOSIS — Z3A34 34 weeks gestation of pregnancy: Secondary | ICD-10-CM | POA: Diagnosis not present

## 2021-12-25 DIAGNOSIS — N390 Urinary tract infection, site not specified: Secondary | ICD-10-CM | POA: Diagnosis not present

## 2021-12-25 DIAGNOSIS — Z87891 Personal history of nicotine dependence: Secondary | ICD-10-CM | POA: Diagnosis not present

## 2021-12-25 LAB — URINALYSIS, ROUTINE W REFLEX MICROSCOPIC
Bilirubin Urine: NEGATIVE
Glucose, UA: NEGATIVE mg/dL
Hgb urine dipstick: NEGATIVE
Ketones, ur: 20 mg/dL — AB
Nitrite: NEGATIVE
Protein, ur: NEGATIVE mg/dL
Specific Gravity, Urine: 1.016 (ref 1.005–1.030)
pH: 7 (ref 5.0–8.0)

## 2021-12-25 MED ORDER — OXYCODONE-ACETAMINOPHEN 5-325 MG PO TABS: 1.0000 | ORAL_TABLET | Freq: Four times a day (QID) | ORAL | 0 refills | Status: DC | PRN

## 2021-12-25 MED ORDER — CEFADROXIL 500 MG PO CAPS: 500.0000 mg | ORAL_CAPSULE | Freq: Two times a day (BID) | ORAL | 0 refills | Status: AC

## 2021-12-25 MED ORDER — TERBUTALINE SULFATE 1 MG/ML IJ SOLN
0.2500 mg | Freq: Once | INTRAMUSCULAR | Status: AC
  Administered 2021-12-25: 0.25 mg via SUBCUTANEOUS
  Filled 2021-12-25: qty 1

## 2021-12-25 MED ORDER — NIFEDIPINE ER OSMOTIC RELEASE 30 MG PO TB24
30.0000 mg | ORAL_TABLET | Freq: Every day | ORAL | 2 refills | Status: DC
Start: 2021-12-25 — End: 2022-01-14

## 2021-12-25 NOTE — MAU Note (Signed)
Sydney Henry is a 31 y.o. at [redacted]w[redacted]d here in MAU reporting: started contracting again around 0900. Every 2 min. just like they were when last here.  States are "unbearable, this is killing me". No bleeding, no water leaking ?Nausea. +FM ?Onset of complaint: 0900 ?Pain score: 8-10 ?Vitals:  ? 12/25/21 1100  ?BP: 123/81  ?Pulse: 74  ?Resp: 20  ?Temp: 98.4 ?F (36.9 ?C)  ?SpO2: 100%  ?   ?FHT:142 ?Lab orders placed from triage:  urine ?
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
## 2021-12-26 ENCOUNTER — Ambulatory Visit (HOSPITAL_BASED_OUTPATIENT_CLINIC_OR_DEPARTMENT_OTHER): Payer: Medicaid Other | Admitting: *Deleted

## 2021-12-26 ENCOUNTER — Ambulatory Visit: Payer: Medicaid Other | Admitting: *Deleted

## 2021-12-26 ENCOUNTER — Ambulatory Visit: Payer: Medicaid Other | Attending: Obstetrics and Gynecology

## 2021-12-26 ENCOUNTER — Other Ambulatory Visit: Payer: Self-pay | Admitting: Obstetrics and Gynecology

## 2021-12-26 ENCOUNTER — Encounter: Payer: Self-pay | Admitting: *Deleted

## 2021-12-26 VITALS — BP 129/82 | HR 81

## 2021-12-26 DIAGNOSIS — O10913 Unspecified pre-existing hypertension complicating pregnancy, third trimester: Secondary | ICD-10-CM

## 2021-12-26 DIAGNOSIS — Z3A34 34 weeks gestation of pregnancy: Secondary | ICD-10-CM

## 2021-12-26 DIAGNOSIS — O0990 Supervision of high risk pregnancy, unspecified, unspecified trimester: Secondary | ICD-10-CM

## 2021-12-26 DIAGNOSIS — O99013 Anemia complicating pregnancy, third trimester: Secondary | ICD-10-CM

## 2021-12-26 DIAGNOSIS — O10013 Pre-existing essential hypertension complicating pregnancy, third trimester: Secondary | ICD-10-CM | POA: Diagnosis not present

## 2021-12-26 DIAGNOSIS — J45909 Unspecified asthma, uncomplicated: Secondary | ICD-10-CM

## 2021-12-26 DIAGNOSIS — Z3009 Encounter for other general counseling and advice on contraception: Secondary | ICD-10-CM | POA: Diagnosis present

## 2021-12-26 DIAGNOSIS — O26893 Other specified pregnancy related conditions, third trimester: Secondary | ICD-10-CM

## 2021-12-26 DIAGNOSIS — D649 Anemia, unspecified: Secondary | ICD-10-CM

## 2021-12-26 DIAGNOSIS — O09293 Supervision of pregnancy with other poor reproductive or obstetric history, third trimester: Secondary | ICD-10-CM

## 2021-12-26 DIAGNOSIS — O99513 Diseases of the respiratory system complicating pregnancy, third trimester: Secondary | ICD-10-CM

## 2021-12-26 NOTE — Procedures (Signed)
Sydney Henry ?07-Jun-1991 ?[redacted]w[redacted]d ? ?Fetus A Non-Stress Test Interpretation for 12/26/21 ? ?Indication: Unsatisfactory BPP ? ?Fetal Heart Rate A ?Mode: External ?Baseline Rate (A): 145 bpm ?Variability: Moderate ?Accelerations: 15 x 15 ?Decelerations: None ?Multiple birth?: No ? ?Uterine Activity ?Mode: Toco ?Contraction Frequency (min): none ?Resting Tone Palpated: Relaxed ? ?Interpretation (Fetal Testing) ?Nonstress Test Interpretation: Reactive ?Overall Impression: Reassuring for gestational age ?Comments: tracing reviewed by Dr. Grace Bushy ? ? ?

## 2021-12-31 ENCOUNTER — Telehealth (HOSPITAL_COMMUNITY): Payer: Self-pay | Admitting: *Deleted

## 2021-12-31 ENCOUNTER — Telehealth (INDEPENDENT_AMBULATORY_CARE_PROVIDER_SITE_OTHER): Payer: Medicaid Other | Admitting: Obstetrics and Gynecology

## 2021-12-31 ENCOUNTER — Encounter (HOSPITAL_COMMUNITY): Payer: Self-pay | Admitting: *Deleted

## 2021-12-31 VITALS — BP 134/83

## 2021-12-31 DIAGNOSIS — Z3009 Encounter for other general counseling and advice on contraception: Secondary | ICD-10-CM

## 2021-12-31 DIAGNOSIS — O0990 Supervision of high risk pregnancy, unspecified, unspecified trimester: Secondary | ICD-10-CM

## 2021-12-31 DIAGNOSIS — O0993 Supervision of high risk pregnancy, unspecified, third trimester: Secondary | ICD-10-CM

## 2021-12-31 NOTE — Progress Notes (Signed)
? ? ?  TELEHEALTH OBSTETRICS VISIT ENCOUNTER NOTE ? ?Provider location: [facility] [place]  ? ?Patient location: Home/ work/office  ? ?I connected with Sydney Henry on 12/31/21 at  9:35 AM EDT by telephone at home and verified that I am speaking with the correct person using two identifiers.  ?  ?I discussed the limitations, risks, security and privacy concerns of performing an evaluation and management service by telephone and the availability of in person appointments. I also discussed with the patient that there may be a patient responsible charge related to this service. The patient expressed understanding and agreed to proceed. ? ?Subjective:  ?Sydney Henry is a 31 y.o. [id number] at [redacted]w[redacted]d being followed for ongoing prenatal care.  She is currently monitored for the following issues for this low-risk pregnancy and has Anemia affecting pregnancy in third trimester; BMI 30s; Pelvic pain affecting pregnancy; Chronic hypertension; Supervision of high risk pregnancy, antepartum; and Unwanted fertility on their problem list. ? ?Patient reports no complaints. Reports fetal movement. Denies any contractions, bleeding or leaking of fluid.  ? ?The following portions of the patient's history were reviewed and updated as appropriate: allergies, current medications, past family history, past medical history, past social history, past surgical history and problem list.  ? ?Objective:  ?Blood pressure 134/83, last menstrual period 04/30/2021. ?General:  Alert, oriented and cooperative.   ?Mental Status: Normal mood and affect perceived. Normal judgment and thought content.  ?Rest of physical exam deferred due to type of encounter ? ?Assessment and Plan:  ?Pregnancy: [id number] at [redacted]w[redacted]d ?1. Supervision of high risk pregnancy, antepartum ? ?BP 132/83 ?Continue BASA ?Continue procardia XL for BP management.  ?Induction at 37 weeks already scheduled  ? ?2. Unwanted fertility ? ?BTL ? ?Preterm labor symptoms and  general obstetric precautions including but not limited to vaginal bleeding, contractions, leaking of fluid and fetal movement were reviewed in detail with the patient.  ?I discussed the assessment and treatment plan with the patient. The patient was provided an opportunity to ask questions and all were answered. The patient agreed with the plan and demonstrated an understanding of the instructions. The patient was advised to call back or seek an in-person office evaluation/go to MAU at [facility] for any urgent or concerning symptoms. ?Please refer to After Visit Summary for other counseling recommendations.  ? ?I provided 10 minutes of non-face-to-face time during this encounter. ? ?No follow-ups on file. ? ?Future Appointments  ?Date Time Provider Department Center  ?01/02/2022  7:15 AM [facility] NURSE [facility]  ?01/02/2022  7:30 AM [facility] US3 [facility]  ?01/09/2022  7:15 AM [facility] NURSE [facility]  ?01/09/2022  7:30 AM [facility] US3 [facility]  ?01/14/2022  6:30 AM [facility] SCHED ROOM [facility] None  ? ? ?Venia Carbon, NP ?[facility] [employer], [facility] ? ?  ?

## 2021-12-31 NOTE — Progress Notes (Signed)
Connect with pt at 8:57. Pt c/o contractions and has been to MAU  ?

## 2021-12-31 NOTE — Telephone Encounter (Signed)
Preadmission screen  

## 2022-01-02 ENCOUNTER — Other Ambulatory Visit: Payer: Self-pay | Admitting: Obstetrics and Gynecology

## 2022-01-02 ENCOUNTER — Encounter: Payer: Self-pay | Admitting: *Deleted

## 2022-01-02 ENCOUNTER — Ambulatory Visit: Payer: Medicaid Other | Admitting: *Deleted

## 2022-01-02 ENCOUNTER — Ambulatory Visit: Payer: Medicaid Other | Attending: Obstetrics and Gynecology

## 2022-01-02 VITALS — BP 145/92 | HR 92

## 2022-01-02 DIAGNOSIS — O0990 Supervision of high risk pregnancy, unspecified, unspecified trimester: Secondary | ICD-10-CM | POA: Insufficient documentation

## 2022-01-02 DIAGNOSIS — O10913 Unspecified pre-existing hypertension complicating pregnancy, third trimester: Secondary | ICD-10-CM | POA: Insufficient documentation

## 2022-01-02 DIAGNOSIS — Z3A35 35 weeks gestation of pregnancy: Secondary | ICD-10-CM

## 2022-01-02 DIAGNOSIS — D649 Anemia, unspecified: Secondary | ICD-10-CM

## 2022-01-02 DIAGNOSIS — O10013 Pre-existing essential hypertension complicating pregnancy, third trimester: Secondary | ICD-10-CM

## 2022-01-02 DIAGNOSIS — J45909 Unspecified asthma, uncomplicated: Secondary | ICD-10-CM

## 2022-01-02 DIAGNOSIS — Z3009 Encounter for other general counseling and advice on contraception: Secondary | ICD-10-CM | POA: Diagnosis present

## 2022-01-02 DIAGNOSIS — O99013 Anemia complicating pregnancy, third trimester: Secondary | ICD-10-CM

## 2022-01-02 DIAGNOSIS — O99513 Diseases of the respiratory system complicating pregnancy, third trimester: Secondary | ICD-10-CM

## 2022-01-02 DIAGNOSIS — O09293 Supervision of pregnancy with other poor reproductive or obstetric history, third trimester: Secondary | ICD-10-CM | POA: Diagnosis not present

## 2022-01-09 ENCOUNTER — Ambulatory Visit (INDEPENDENT_AMBULATORY_CARE_PROVIDER_SITE_OTHER): Payer: Medicaid Other | Admitting: Obstetrics

## 2022-01-09 ENCOUNTER — Encounter: Payer: Self-pay | Admitting: *Deleted

## 2022-01-09 ENCOUNTER — Other Ambulatory Visit (HOSPITAL_COMMUNITY)
Admission: RE | Admit: 2022-01-09 | Discharge: 2022-01-09 | Disposition: A | Discharge status: Home / Self Care | Payer: Medicaid Other | Source: Ambulatory Visit | Attending: Obstetrics | Admitting: Obstetrics

## 2022-01-09 ENCOUNTER — Ambulatory Visit: Payer: Medicaid Other | Attending: Obstetrics and Gynecology

## 2022-01-09 ENCOUNTER — Other Ambulatory Visit: Payer: Self-pay | Admitting: Advanced Practice Midwife

## 2022-01-09 ENCOUNTER — Ambulatory Visit: Payer: Medicaid Other | Admitting: *Deleted

## 2022-01-09 VITALS — BP 130/82 | HR 97

## 2022-01-09 VITALS — BP 126/84 | HR 76 | Wt 220.7 lb

## 2022-01-09 DIAGNOSIS — O26893 Other specified pregnancy related conditions, third trimester: Secondary | ICD-10-CM | POA: Diagnosis not present

## 2022-01-09 DIAGNOSIS — O0990 Supervision of high risk pregnancy, unspecified, unspecified trimester: Secondary | ICD-10-CM | POA: Insufficient documentation

## 2022-01-09 DIAGNOSIS — D649 Anemia, unspecified: Secondary | ICD-10-CM | POA: Insufficient documentation

## 2022-01-09 DIAGNOSIS — O09293 Supervision of pregnancy with other poor reproductive or obstetric history, third trimester: Secondary | ICD-10-CM | POA: Diagnosis not present

## 2022-01-09 DIAGNOSIS — O10913 Unspecified pre-existing hypertension complicating pregnancy, third trimester: Secondary | ICD-10-CM | POA: Diagnosis present

## 2022-01-09 DIAGNOSIS — O10013 Pre-existing essential hypertension complicating pregnancy, third trimester: Secondary | ICD-10-CM | POA: Diagnosis not present

## 2022-01-09 DIAGNOSIS — O99013 Anemia complicating pregnancy, third trimester: Secondary | ICD-10-CM

## 2022-01-09 DIAGNOSIS — R102 Pelvic and perineal pain: Secondary | ICD-10-CM | POA: Insufficient documentation

## 2022-01-09 DIAGNOSIS — O10919 Unspecified pre-existing hypertension complicating pregnancy, unspecified trimester: Secondary | ICD-10-CM

## 2022-01-09 DIAGNOSIS — Z3A36 36 weeks gestation of pregnancy: Secondary | ICD-10-CM | POA: Diagnosis not present

## 2022-01-09 DIAGNOSIS — O0993 Supervision of high risk pregnancy, unspecified, third trimester: Secondary | ICD-10-CM | POA: Insufficient documentation

## 2022-01-09 DIAGNOSIS — J45909 Unspecified asthma, uncomplicated: Secondary | ICD-10-CM

## 2022-01-09 DIAGNOSIS — Z3009 Encounter for other general counseling and advice on contraception: Secondary | ICD-10-CM | POA: Insufficient documentation

## 2022-01-09 DIAGNOSIS — O99513 Diseases of the respiratory system complicating pregnancy, third trimester: Secondary | ICD-10-CM

## 2022-01-09 NOTE — Progress Notes (Signed)
Patient presents for ROB. Patient has no concerns today. 

## 2022-01-09 NOTE — Progress Notes (Signed)
Subjective:  Sydney Henry is a 31 y.o. [id number] at [redacted]w[redacted]d being seen today for ongoing prenatal care.  She is currently monitored for the following issues for this high-risk pregnancy and has Anemia affecting pregnancy in third trimester; BMI 30s; Pelvic pain affecting pregnancy; Chronic hypertension; Supervision of high risk pregnancy, antepartum; and Unwanted fertility on their problem list.  Patient reports no complaints.  Contractions: Irritability. Vag. Bleeding: None.  Movement: Present. Denies leaking of fluid.   The following portions of the patient's history were reviewed and updated as appropriate: allergies, current medications, past family history, past medical history, past social history, past surgical history and problem list. Problem list updated.  Objective:   Vitals:   01/09/22 1507  BP: 126/84  Pulse: 76  Weight: 220 lb 11.2 oz (100.1 kg)    Fetal Status:     Movement: Present     General:  Alert, oriented and cooperative. Patient is in no acute distress.  Skin: Skin is warm and dry. No rash noted.   Cardiovascular: Normal heart rate noted  Respiratory: Normal respiratory effort, no problems with respiration noted  Abdomen: Soft, gravid, appropriate for gestational age. Pain/Pressure: Present     Pelvic:  Cervical exam deferred        Extremities: Normal range of motion.  Edema: None  Mental Status: Normal mood and affect. Normal behavior. Normal judgment and thought content.   Urinalysis:      Assessment and Plan:  Pregnancy: [id number] at [redacted]w[redacted]d  1. Supervision of high risk pregnancy, antepartum Rx: - Strep Gp B NAA - Cervicovaginal ancillary only( [facility])  Preterm labor symptoms and general obstetric precautions including but not limited to vaginal bleeding, contractions, leaking of fluid and fetal movement were reviewed in detail with the patient. Please refer to After Visit Summary for other counseling recommendations.   Return in about 6 weeks (around  02/20/2022) for postpartum visit.   Brock Bad, MD  01/09/22

## 2022-01-11 LAB — STREP GP B NAA: Strep Gp B NAA: NEGATIVE

## 2022-01-12 ENCOUNTER — Inpatient Hospital Stay (HOSPITAL_COMMUNITY)
Admission: AD | Admit: 2022-01-12 | Discharge: 2022-01-14 | DRG: 796 | Disposition: A | Discharge status: Home / Self Care | Payer: Medicaid Other | Attending: Obstetrics and Gynecology | Admitting: Obstetrics and Gynecology

## 2022-01-12 ENCOUNTER — Encounter (HOSPITAL_COMMUNITY): Payer: Self-pay | Admitting: Obstetrics & Gynecology

## 2022-01-12 ENCOUNTER — Inpatient Hospital Stay (HOSPITAL_COMMUNITY): Payer: Medicaid Other | Admitting: Anesthesiology

## 2022-01-12 ENCOUNTER — Other Ambulatory Visit: Payer: Self-pay

## 2022-01-12 DIAGNOSIS — Z3A36 36 weeks gestation of pregnancy: Secondary | ICD-10-CM | POA: Diagnosis not present

## 2022-01-12 DIAGNOSIS — O10919 Unspecified pre-existing hypertension complicating pregnancy, unspecified trimester: Secondary | ICD-10-CM | POA: Diagnosis present

## 2022-01-12 DIAGNOSIS — Z302 Encounter for sterilization: Secondary | ICD-10-CM

## 2022-01-12 DIAGNOSIS — Z87891 Personal history of nicotine dependence: Secondary | ICD-10-CM | POA: Diagnosis not present

## 2022-01-12 DIAGNOSIS — O0990 Supervision of high risk pregnancy, unspecified, unspecified trimester: Secondary | ICD-10-CM

## 2022-01-12 DIAGNOSIS — O1002 Pre-existing essential hypertension complicating childbirth: Secondary | ICD-10-CM | POA: Diagnosis present

## 2022-01-12 DIAGNOSIS — Z3009 Encounter for other general counseling and advice on contraception: Secondary | ICD-10-CM

## 2022-01-12 LAB — CBC
HCT: 31.2 % — ABNORMAL LOW (ref 36.0–46.0)
Hemoglobin: 10.1 g/dL — ABNORMAL LOW (ref 12.0–15.0)
MCH: 28.2 pg (ref 26.0–34.0)
MCHC: 32.4 g/dL (ref 30.0–36.0)
MCV: 87.2 fL (ref 80.0–100.0)
Platelets: 234 10*3/uL (ref 150–400)
RBC: 3.58 MIL/uL — ABNORMAL LOW (ref 3.87–5.11)
RDW: 13.1 % (ref 11.5–15.5)
WBC: 8.1 10*3/uL (ref 4.0–10.5)
nRBC: 0 % (ref 0.0–0.2)

## 2022-01-12 LAB — COMPREHENSIVE METABOLIC PANEL
ALT: 11 U/L (ref 0–44)
AST: 17 U/L (ref 15–41)
Albumin: 3.1 g/dL — ABNORMAL LOW (ref 3.5–5.0)
Alkaline Phosphatase: 88 U/L (ref 38–126)
Anion gap: 8 (ref 5–15)
BUN: <5 mg/dL — ABNORMAL LOW (ref 6–20)
CO2: 21 mmol/L — ABNORMAL LOW (ref 22–32)
Calcium: 8.7 mg/dL — ABNORMAL LOW (ref 8.9–10.3)
Chloride: 108 mmol/L (ref 98–111)
Creatinine, Ser: 0.47 mg/dL (ref 0.44–1.00)
GFR, Estimated: >60 mL/min (ref >60)
Glucose, Bld: 72 mg/dL (ref 70–99)
Potassium: 3.4 mmol/L — ABNORMAL LOW (ref 3.5–5.1)
Sodium: 137 mmol/L (ref 135–145)
Total Bilirubin: 0.3 mg/dL (ref 0.3–1.2)
Total Protein: 6.7 g/dL (ref 6.5–8.1)

## 2022-01-12 LAB — URINALYSIS, ROUTINE W REFLEX MICROSCOPIC
Bilirubin Urine: NEGATIVE
Glucose, UA: NEGATIVE mg/dL
Hgb urine dipstick: NEGATIVE
Ketones, ur: 20 mg/dL — AB
Nitrite: NEGATIVE
Protein, ur: NEGATIVE mg/dL
Specific Gravity, Urine: 1.008 (ref 1.005–1.030)
pH: 6 (ref 5.0–8.0)

## 2022-01-12 LAB — TYPE AND SCREEN
ABO/RH(D): B POS
Antibody Screen: NEGATIVE

## 2022-01-12 LAB — PROTEIN / CREATININE RATIO, URINE
Creatinine, Urine: 66.32 mg/dL
Protein Creatinine Ratio: 0.11 mg/mg{Cre} (ref 0.00–0.15)
Total Protein, Urine: 7 mg/dL

## 2022-01-12 LAB — RPR: RPR Ser Ql: NONREACTIVE

## 2022-01-12 MED ORDER — FENTANYL CITRATE (PF) 100 MCG/2ML IJ SOLN
100.0000 ug | INTRAMUSCULAR | Status: DC | PRN
  Administered 2022-01-12: 100 ug via INTRAVENOUS
  Filled 2022-01-12: qty 2

## 2022-01-12 MED ORDER — ACETAMINOPHEN 325 MG PO TABS
650.0000 mg | ORAL_TABLET | ORAL | Status: DC | PRN
  Administered 2022-01-13: 650 mg via ORAL
  Filled 2022-01-12: qty 2

## 2022-01-12 MED ORDER — OXYTOCIN-SODIUM CHLORIDE 30-0.9 UT/500ML-% IV SOLN
1.0000 m[IU]/min | INTRAVENOUS | Status: DC
  Administered 2022-01-12: 2 m[IU]/min via INTRAVENOUS

## 2022-01-12 MED ORDER — CYCLOBENZAPRINE HCL 5 MG PO TABS
10.0000 mg | ORAL_TABLET | Freq: Once | ORAL | Status: AC
  Administered 2022-01-12: 10 mg via ORAL
  Filled 2022-01-12: qty 2

## 2022-01-12 MED ORDER — OXYTOCIN BOLUS FROM INFUSION
333.0000 mL | Freq: Once | INTRAVENOUS | Status: AC
  Administered 2022-01-13: 333 mL via INTRAVENOUS

## 2022-01-12 MED ORDER — FENTANYL-BUPIVACAINE-NACL 0.5-0.125-0.9 MG/250ML-% EP SOLN
EPIDURAL | Status: DC | PRN
  Administered 2022-01-12: 12 mL/h via EPIDURAL

## 2022-01-12 MED ORDER — PHENYLEPHRINE HCL (PRESSORS) 10 MG/ML IV SOLN
INTRAVENOUS | Status: DC | PRN
  Administered 2022-01-12 – 2022-01-13 (×3): 80 ug via INTRAVENOUS

## 2022-01-12 MED ORDER — LACTATED RINGERS IV SOLN: INTRAVENOUS | Status: DC

## 2022-01-12 MED ORDER — TERBUTALINE SULFATE 1 MG/ML IJ SOLN: 0.2500 mg | Freq: Once | INTRAMUSCULAR | Status: DC | PRN

## 2022-01-12 MED ORDER — MISOPROSTOL 25 MCG QUARTER TABLET: 25.0000 ug | ORAL_TABLET | ORAL | Status: DC | PRN

## 2022-01-12 MED ORDER — LIDOCAINE HCL (PF) 1 % IJ SOLN
INTRAMUSCULAR | Status: DC | PRN
  Administered 2022-01-12: 2 mL via EPIDURAL
  Administered 2022-01-12: 10 mL via EPIDURAL

## 2022-01-12 MED ORDER — NIFEDIPINE ER OSMOTIC RELEASE 30 MG PO TB24
30.0000 mg | ORAL_TABLET | Freq: Every day | ORAL | Status: DC
  Administered 2022-01-12: 30 mg via ORAL
  Filled 2022-01-12 (×2): qty 1

## 2022-01-12 MED ORDER — PHENYLEPHRINE 80 MCG/ML (10ML) SYRINGE FOR IV PUSH (FOR BLOOD PRESSURE SUPPORT)
80.0000 ug | PREFILLED_SYRINGE | INTRAVENOUS | Status: DC | PRN
Start: 2022-01-12 — End: 2022-01-13

## 2022-01-12 MED ORDER — EPHEDRINE 5 MG/ML INJ: 10.0000 mg | INTRAVENOUS | Status: DC | PRN

## 2022-01-12 MED ORDER — BUPIVACAINE HCL (PF) 0.25 % IJ SOLN
INTRAMUSCULAR | Status: DC | PRN
  Administered 2022-01-12: 5 mL via EPIDURAL

## 2022-01-12 MED ORDER — ONDANSETRON HCL 4 MG/2ML IJ SOLN
4.0000 mg | Freq: Four times a day (QID) | INTRAMUSCULAR | Status: DC | PRN
Start: 2022-01-12 — End: 2022-01-13
  Administered 2022-01-12: 4 mg via INTRAVENOUS
  Filled 2022-01-12: qty 2

## 2022-01-12 MED ORDER — OXYCODONE-ACETAMINOPHEN 5-325 MG PO TABS: 1.0000 | ORAL_TABLET | ORAL | Status: DC | PRN

## 2022-01-12 MED ORDER — DIPHENHYDRAMINE HCL 50 MG/ML IJ SOLN
12.5000 mg | INTRAMUSCULAR | Status: AC | PRN
  Administered 2022-01-12 (×3): 12.5 mg via INTRAVENOUS
  Filled 2022-01-12: qty 1

## 2022-01-12 MED ORDER — LACTATED RINGERS IV SOLN: 500.0000 mL | Freq: Once | INTRAVENOUS | Status: DC

## 2022-01-12 MED ORDER — LIDOCAINE-EPINEPHRINE (PF) 2 %-1:200000 IJ SOLN
INTRAMUSCULAR | Status: DC | PRN
  Administered 2022-01-12: 5 mL via EPIDURAL

## 2022-01-12 MED ORDER — LACTATED RINGERS IV SOLN: 500.0000 mL | INTRAVENOUS | Status: DC | PRN

## 2022-01-12 MED ORDER — OXYTOCIN-SODIUM CHLORIDE 30-0.9 UT/500ML-% IV SOLN
2.5000 [IU]/h | INTRAVENOUS | Status: DC
  Administered 2022-01-13: 2.5 [IU]/h via INTRAVENOUS
  Filled 2022-01-12: qty 500

## 2022-01-12 MED ORDER — OXYTOCIN-SODIUM CHLORIDE 30-0.9 UT/500ML-% IV SOLN
1.0000 m[IU]/min | INTRAVENOUS | Status: DC
  Administered 2022-01-13: 2 m[IU]/min via INTRAVENOUS

## 2022-01-12 MED ORDER — OXYCODONE-ACETAMINOPHEN 5-325 MG PO TABS: 2.0000 | ORAL_TABLET | ORAL | Status: DC | PRN

## 2022-01-12 MED ORDER — PHENYLEPHRINE 80 MCG/ML (10ML) SYRINGE FOR IV PUSH (FOR BLOOD PRESSURE SUPPORT)
80.0000 ug | PREFILLED_SYRINGE | INTRAVENOUS | Status: DC | PRN
  Filled 2022-01-12: qty 10

## 2022-01-12 MED ORDER — FENTANYL-BUPIVACAINE-NACL 0.5-0.125-0.9 MG/250ML-% EP SOLN
12.0000 mL/h | EPIDURAL | Status: DC | PRN
  Administered 2022-01-13: 12 mL/h via EPIDURAL
  Filled 2022-01-12 (×2): qty 250

## 2022-01-12 MED ORDER — SODIUM CHLORIDE 0.9 % IV SOLN
14.0000 mL/h | INTRAVENOUS | Status: DC | PRN
  Administered 2022-01-12: 12 mL/h via EPIDURAL
  Administered 2022-01-12: 14 mL/h via EPIDURAL
  Filled 2022-01-12 (×5): qty 17

## 2022-01-12 MED ORDER — SOD CITRATE-CITRIC ACID 500-334 MG/5ML PO SOLN: 30.0000 mL | ORAL | Status: DC | PRN

## 2022-01-12 MED ORDER — LIDOCAINE HCL (PF) 1 % IJ SOLN: 30.0000 mL | INTRAMUSCULAR | Status: DC | PRN

## 2022-01-12 NOTE — Progress Notes (Signed)
Sydney Henry is a 31 y.o. [id number] at [redacted]w[redacted]d by LMP admitted for labor  Subjective: Patient is resting comfortably with epidural.   Objective: BP 136/73   Pulse 65   Temp 97.7 F (36.5 C) (Oral)   Resp 15   Ht 5' 8.5" (1.74 m)   Wt 98.9 kg   LMP 04/30/2021 (Exact Date)   SpO2 100%   BMI 32.66 kg/m  No intake/output data recorded. No intake/output data recorded.  FHT: 145 bpm, moderate variability, +15x15 accels, no decels UC: Q 3-5 minutes by palpation SVE:   Dilation: 5 Effacement (%): 80 Station: -2 Exam by:: Camelia Eng CNM  Labs: Lab Results  Component Value Date   WBC 8.1 01/12/2022   HGB 10.1 (L) 01/12/2022   HCT 31.2 (L) 01/12/2022   MCV 87.2 01/12/2022   PLT 234 01/12/2022    Assessment / Plan: Sydney Henry is a 31 y.o [phone] at [redacted]w[redacted]d admitted for labor  Labor: Patient has progressed from this morning with a decent amount of bloody show. Reviewed with Dr. Vergie Living and okay to proceed with augmentation given progression of labor. Reviewed R/B/A to AROM with patient and patient verbally consents. AROM with moderate amount of pink-tinged fluid. Patient and fetus tolerated procedure well.  cHTN:  Procardia daily. BP's normotensive to mild range. Labs unremarkable, patient asymptomatic. Continue to monitor Fetal Wellbeing:  Category I Pain Control:  Epidural I/D:  GBS neg Anticipated MOD:  NSVD   Brand Males, CNM 01/12/22, 6:34 PM

## 2022-01-12 NOTE — H&P (Signed)
Pt calm and no longer crying or tearful with contractions

## 2022-01-12 NOTE — Anesthesia Preprocedure Evaluation (Signed)
Anesthesia Evaluation  Patient identified by MRN, date of birth, ID band Patient awake    Reviewed: Allergy & Precautions, Patient's Chart, lab work & pertinent test results  History of Anesthesia Complications (+) history of anesthetic complications (itching w/ last epidural, responded well to benadryl)  Airway Mallampati: II  TM Distance: >3 FB Neck ROM: Full    Dental no notable dental hx.    Pulmonary asthma , former smoker,    Pulmonary exam normal breath sounds clear to auscultation       Cardiovascular hypertension, Pt. on medications Normal cardiovascular exam Rhythm:Regular Rate:Normal     Neuro/Psych negative neurological ROS  negative psych ROS   GI/Hepatic negative GI ROS, Neg liver ROS,   Endo/Other  negative endocrine ROS  Renal/GU negative Renal ROS  negative genitourinary   Musculoskeletal negative musculoskeletal ROS (+)   Abdominal   Peds negative pediatric ROS (+)  Hematology negative hematology ROS (+) Hb 10, plt 234   Anesthesia Other Findings   Reproductive/Obstetrics (+) Pregnancy                             Anesthesia Physical Anesthesia Plan  ASA: 2  Anesthesia Plan: Epidural   Post-op Pain Management:    Induction:   PONV Risk Score and Plan: 2  Airway Management Planned: Natural Airway  Additional Equipment: None  Intra-op Plan:   Post-operative Plan:   Informed Consent: I have reviewed the patients History and Physical, chart, labs and discussed the procedure including the risks, benefits and alternatives for the proposed anesthesia with the patient or authorized representative who has indicated his/her understanding and acceptance.       Plan Discussed with:   Anesthesia Plan Comments:         Anesthesia Quick Evaluation
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
## 2022-01-12 NOTE — Anesthesia Procedure Notes (Signed)
Epidural Patient location during procedure: OB Start time: 01/12/2022 6:33 AM End time: 01/12/2022 6:46 AM  Staffing Anesthesiologist: Lannie Fields, DO Performed: anesthesiologist   Preanesthetic Checklist Completed: patient identified, IV checked, risks and benefits discussed, monitors and equipment checked, pre-op evaluation and timeout performed  Epidural Patient position: sitting Prep: DuraPrep and site prepped and draped Patient monitoring: continuous pulse ox, blood pressure, heart rate and cardiac monitor Approach: midline Location: L3-L4 Injection technique: LOR air  Needle:  Needle type: Tuohy  Needle gauge: 17 G Needle length: 9 cm Needle insertion depth: 7 cm Catheter type: closed end flexible Catheter size: 19 Gauge Catheter at skin depth: 12 cm Test dose: negative  Assessment Sensory level: T8 Events: blood not aspirated, injection not painful, no injection resistance, no paresthesia and negative IV test  Additional Notes Patient identified. Risks/Benefits/Options discussed with patient including but not limited to bleeding, infection, nerve damage, paralysis, failed block, incomplete pain control, headache, blood pressure changes, nausea, vomiting, reactions to medication both or allergic, itching and postpartum back pain. Confirmed with bedside nurse the patient's most recent platelet count. Confirmed with patient that they are not currently taking any anticoagulation, have any bleeding history or any family history of bleeding disorders. Patient expressed understanding and wished to proceed. All questions were answered. Sterile technique was used throughout the entire procedure. Please see nursing notes for vital signs. Test dose was given through epidural catheter and negative prior to continuing to dose epidural or start infusion. Warning signs of high block given to the patient including shortness of breath, tingling/numbness in hands, complete motor  block, or any concerning symptoms with instructions to call for help. Patient was given instructions on fall risk and not to get out of bed. All questions and concerns addressed with instructions to call with any issues or inadequate analgesia.  Reason for block:procedure for pain

## 2022-01-12 NOTE — Progress Notes (Signed)
Sydney Henry is a 31 y.o. [id number] at [redacted]w[redacted]d by LMP admitted for spontaneous onset of labor  Subjective: Pt somnolent at bedside. Arouses to voice. No complaints voiced  Objective: BP 121/61   Pulse 77   Temp 98.2 F (36.8 C) (Oral)   Resp 15   Ht 5' 8.5" (1.74 m)   Wt 98.9 kg   LMP 04/30/2021 (Exact Date)   SpO2 100%   BMI 32.66 kg/m  No intake/output data recorded. No intake/output data recorded.  FHT:  FHR: 135 bpm, variability: moderate,  accelerations:  Abscent,  decelerations:  Absent UC:   irregular SVE:   Dilation: 4 Effacement (%): 70 Station: -2 Exam by:: [employer]: Lab Results  Component Value Date   WBC 8.1 01/12/2022   HGB 10.1 (L) 01/12/2022   HCT 31.2 (L) 01/12/2022   MCV 87.2 01/12/2022   PLT 234 01/12/2022    Assessment / Plan: Spontaneous labor of IUP at [redacted]w[redacted]d  . AROM attempt unsuccessful, will augment with pitocin   Labor:  initiate pitocin Preeclampsia:  labs stable Fetal Wellbeing:  Category I Pain Control:  Epidural I/D:  n/a Anticipated MOD:  NSVD  Onome Agbaza, SNM 01/12/2022, 7:50AM

## 2022-01-12 NOTE — MAU Note (Signed)
Pt states that she feels pinch and spasms in her vagina and rectum during contractions. Tearful.

## 2022-01-12 NOTE — Progress Notes (Signed)
Sydney Henry is a 31 y.o. [id number] at [redacted]w[redacted]d by LMP admitted for labor  Subjective: Patient requesting to speak with CNM. Patient is upset about labor progress and is expressing frustration with having previously been started on Pitocin but then immediately having it stopped. She is requesting a cervical exam at this time. Patient is resting comfortably with epidural. Her family is present at bedside  Objective: BP (!) 141/98   Pulse (!) 110   Temp 97.7 F (36.5 C) (Oral)   Resp 15   Ht 5' 8.5" (1.74 m)   Wt 98.9 kg   LMP 04/30/2021 (Exact Date)   SpO2 100%   BMI 32.66 kg/m  No intake/output data recorded. No intake/output data recorded.  FHT: 140 bpm, moderate variability, +15x15 accels, no decels UC: Q 3-5 minutes SVE:   Dilation: 4 Effacement (%): 70 Station: -2 Exam by:: Camelia Eng, CNM  Labs: Lab Results  Component Value Date   WBC 8.1 01/12/2022   HGB 10.1 (L) 01/12/2022   HCT 31.2 (L) 01/12/2022   MCV 87.2 01/12/2022   PLT 234 01/12/2022    Assessment / Plan: Darnelle Spangle D. Sobieski is a 31 y.o [phone] at [redacted]w[redacted]d admitted for labor  Labor: I reviewed with patient recommendations for avoiding augmentation of labor in setting of being preterm without medical indication. Patient is understandably upset, but understands. Cervix is unchanged. Patient is contracting Q3-5 mins without any augmentation. I recommend that since patient has an epidural to take a nap as she has been awake for almost 24 hours at this time. I will reassess patient later this evening or sooner prn if status were to change.   cHTN:  Ordered daily Procardia. BP's normotensive to mild range. Labs unremarkable. Patient asymptomatic. Continue to monitor.  Fetal Wellbeing:  Category I Pain Control:  Epidural I/D:  GBS neg Anticipated MOD:  NSVD   Brand Males, CNM 01/12/22, 12:33 PM

## 2022-01-12 NOTE — MAU Note (Signed)
Pt states contractions are more intense and she is ready for her epidural.

## 2022-01-12 NOTE — Progress Notes (Signed)
Labor Progress Note Sydney Henry is a 31 y.o. [id number] at [redacted]w[redacted]d presented for PTL  S:  Feeling pain with ctx, states epidural has worn off. Had 2 recent doses of PCEA.  O:  BP (!) 139/103   Pulse 76   Temp 97.7 F (36.5 C) (Oral)   Resp 15   Ht 5' 8.5" (1.74 m)   Wt 98.9 kg   LMP 04/30/2021 (Exact Date)   SpO2 100%   BMI 32.66 kg/m  EFM: baseline 145 bpm/ mod variability/ no accels/ no decels  Toco/IUPC: 3-4 SVE: Dilation: 5 Effacement (%): 80 Cervical Position: Posterior Station: -2 Presentation: Vertex Exam by:: Maryagnes Amos CNM   A/P: 31 y.o. [id number] [redacted]w[redacted]d  1. Labor: early active 2. FWB: Cat I 3. Pain: epidural 4. CHTN: stable  Will consult anesthesia for epidural management. Consider Pitocin if no progress at next check.  Julianne Handler, CNM 9:37 PM

## 2022-01-12 NOTE — MAU Note (Signed)
Pt states that the flexeril was ineffective and wants something for pain -she states needs something for pain-contractions every 12-16 minutes which was confirmed with patient. "But the contraction pain is too intense even though they aren't close together."

## 2022-01-12 NOTE — MAU Note (Signed)
Sydney Henry is a 31 y.o. at [redacted]w[redacted]d here in MAU reporting: ctx for the last few hours, feel 3-5 min apart. Having vaginal pressure. Pain is making her nauseous.  LMP:  Onset of complaint: today Pain score: 8/10  Vitals:   01/12/22 0214  BP: (!) 146/88  Pulse: 66  Resp: 18  Temp: 98.1 F (36.7 C)  SpO2: 100%     FHT:142 Lab orders placed from triage:  Urinalysis

## 2022-01-12 NOTE — Progress Notes (Signed)
Pitocin to be discontinued to allow expectantly managed labor of pre-term gestation. Orders given to bedside RN to stop at this time.  Onome Agbaza SNM 01/12/2022, 8:29 AM

## 2022-01-13 ENCOUNTER — Encounter (HOSPITAL_COMMUNITY): Payer: Self-pay | Admitting: Family Medicine

## 2022-01-13 DIAGNOSIS — Z3A36 36 weeks gestation of pregnancy: Secondary | ICD-10-CM

## 2022-01-13 DIAGNOSIS — Z302 Encounter for sterilization: Secondary | ICD-10-CM

## 2022-01-13 DIAGNOSIS — O1002 Pre-existing essential hypertension complicating childbirth: Secondary | ICD-10-CM

## 2022-01-13 LAB — CERVICOVAGINAL ANCILLARY ONLY
Bacterial Vaginitis (gardnerella): POSITIVE — AB
Candida Glabrata: NEGATIVE
Candida Vaginitis: NEGATIVE
Chlamydia: NEGATIVE
Comment: NEGATIVE
Comment: NEGATIVE
Comment: NEGATIVE
Comment: NEGATIVE
Comment: NEGATIVE
Comment: NORMAL
Neisseria Gonorrhea: NEGATIVE
Trichomonas: NEGATIVE

## 2022-01-13 MED ORDER — ONDANSETRON HCL 4 MG PO TABS: 4.0000 mg | ORAL_TABLET | ORAL | Status: DC | PRN

## 2022-01-13 MED ORDER — BENZOCAINE-MENTHOL 20-0.5 % EX AERO: 1.0000 "application " | INHALATION_SPRAY | CUTANEOUS | Status: DC | PRN

## 2022-01-13 MED ORDER — IBUPROFEN 600 MG PO TABS
600.0000 mg | ORAL_TABLET | Freq: Four times a day (QID) | ORAL | Status: DC
  Administered 2022-01-13 – 2022-01-14 (×5): 600 mg via ORAL
  Filled 2022-01-13 (×5): qty 1

## 2022-01-13 MED ORDER — METOCLOPRAMIDE HCL 10 MG PO TABS
10.0000 mg | ORAL_TABLET | Freq: Once | ORAL | Status: DC
  Filled 2022-01-13: qty 1

## 2022-01-13 MED ORDER — TETANUS-DIPHTH-ACELL PERTUSSIS 5-2.5-18.5 LF-MCG/0.5 IM SUSY
0.5000 mL | PREFILLED_SYRINGE | Freq: Once | INTRAMUSCULAR | Status: DC
Start: 2022-01-14 — End: 2022-01-14

## 2022-01-13 MED ORDER — DIBUCAINE (PERIANAL) 1 % EX OINT: 1.0000 "application " | TOPICAL_OINTMENT | CUTANEOUS | Status: DC | PRN

## 2022-01-13 MED ORDER — DIPHENHYDRAMINE HCL 25 MG PO CAPS: 25.0000 mg | ORAL_CAPSULE | Freq: Four times a day (QID) | ORAL | Status: DC | PRN

## 2022-01-13 MED ORDER — WITCH HAZEL-GLYCERIN EX PADS: 1.0000 "application " | MEDICATED_PAD | CUTANEOUS | Status: DC | PRN

## 2022-01-13 MED ORDER — MEASLES, MUMPS & RUBELLA VAC IJ SOLR: 0.5000 mL | Freq: Once | INTRAMUSCULAR | Status: DC

## 2022-01-13 MED ORDER — LACTATED RINGERS IV SOLN: INTRAVENOUS | Status: DC

## 2022-01-13 MED ORDER — ONDANSETRON HCL 4 MG/2ML IJ SOLN
4.0000 mg | INTRAMUSCULAR | Status: DC | PRN
Start: 2022-01-13 — End: 2022-01-14

## 2022-01-13 MED ORDER — PRENATAL MULTIVITAMIN CH
1.0000 | ORAL_TABLET | Freq: Every day | ORAL | Status: DC
  Administered 2022-01-13 – 2022-01-14 (×2): 1 via ORAL
  Filled 2022-01-13 (×2): qty 1

## 2022-01-13 MED ORDER — ACETAMINOPHEN 325 MG PO TABS
650.0000 mg | ORAL_TABLET | ORAL | Status: DC | PRN
  Administered 2022-01-13 (×3): 650 mg via ORAL
  Filled 2022-01-13 (×3): qty 2

## 2022-01-13 MED ORDER — FAMOTIDINE 20 MG PO TABS
40.0000 mg | ORAL_TABLET | Freq: Once | ORAL | Status: DC
  Filled 2022-01-13: qty 2

## 2022-01-13 MED ORDER — SIMETHICONE 80 MG PO CHEW: 80.0000 mg | CHEWABLE_TABLET | ORAL | Status: DC | PRN

## 2022-01-13 MED ORDER — COCONUT OIL OIL: 1.0000 "application " | TOPICAL_OIL | Status: DC | PRN

## 2022-01-13 MED ORDER — SENNOSIDES-DOCUSATE SODIUM 8.6-50 MG PO TABS
2.0000 | ORAL_TABLET | ORAL | Status: DC
  Administered 2022-01-13: 2 via ORAL
  Filled 2022-01-13: qty 2

## 2022-01-13 NOTE — Anesthesia Preprocedure Evaluation (Addendum)
Anesthesia Evaluation  Patient identified by MRN, date of birth, ID band Patient awake    Reviewed: Allergy & Precautions, NPO status , Patient's Chart, lab work & pertinent test results  History of Anesthesia Complications Negative for: history of anesthetic complications  Airway Mallampati: II  TM Distance: >3 FB Neck ROM: Full    Dental no notable dental hx. (+) Teeth Intact, Dental Advisory Given   Pulmonary asthma , former smoker,    Pulmonary exam normal breath sounds clear to auscultation       Cardiovascular hypertension, Normal cardiovascular exam Rhythm:Regular Rate:Normal     Neuro/Psych negative neurological ROS  negative psych ROS   GI/Hepatic negative GI ROS, Neg liver ROS,   Endo/Other  negative endocrine ROS  Renal/GU negative Renal ROS  negative genitourinary   Musculoskeletal negative musculoskeletal ROS (+)   Abdominal   Peds  Hematology  (+) Blood dyscrasia (Hgb 10.1), anemia ,   Anesthesia Other Findings Presents for PPTL. Epidural was removed after delivery.   Reproductive/Obstetrics                          Anesthesia Physical Anesthesia Plan  ASA: 2  Anesthesia Plan: General   Post-op Pain Management: Toradol IV (intra-op)*   Induction:   PONV Risk Score and Plan: 3 and Treatment may vary due to age or medical condition, Ondansetron, Dexamethasone and Midazolam  Airway Management Planned: Oral ETT  Additional Equipment:   Intra-op Plan:   Post-operative Plan: Extubation in OR  Informed Consent: I have reviewed the patients History and Physical, chart, labs and discussed the procedure including the risks, benefits and alternatives for the proposed anesthesia with the patient or authorized representative who has indicated his/her understanding and acceptance.     Dental advisory given  Plan Discussed with: CRNA  Anesthesia Plan Comments: (Pt requests  GA. )     Anesthesia Quick Evaluation

## 2022-01-13 NOTE — Anesthesia Postprocedure Evaluation (Signed)
Anesthesia Post Note  Patient: Sydney Henry  Procedure(s) Performed: AN AD HOC LABOR EPIDURAL     Patient location during evaluation: Mother Baby Anesthesia Type: Epidural Level of consciousness: awake and alert and oriented Pain management: satisfactory to patient Vital Signs Assessment: post-procedure vital signs reviewed and stable Respiratory status: respiratory function stable Cardiovascular status: stable Postop Assessment: no headache, no backache, epidural receding, patient able to bend at knees, no signs of nausea or vomiting, adequate PO intake and able to ambulate Anesthetic complications: no   No notable events documented.  Last Vitals:  Vitals:   01/13/22 0808 01/13/22 1210  BP: 125/77 135/82  Pulse: 62 66  Resp: 16 16  Temp: 36.6 C 36.6 C  SpO2: 100% 100%    Last Pain:  Vitals:   01/13/22 1210  TempSrc: Oral  PainSc:    Pain Goal:                   Sydney Henry

## 2022-01-13 NOTE — Plan of Care (Signed)

## 2022-01-13 NOTE — Progress Notes (Signed)
Feeling more pain with ctx Awaiting anesthesia for re-dose Cervix unchanged from 5/70/-2 Cat I tracing Recommend Pitocin, pt agrees

## 2022-01-13 NOTE — Discharge Summary (Signed)
Postpartum Discharge Summary  Date of Service updated***     Patient Name: Sydney Henry DOB: 1990-11-29 MRN: [id number]  Date of admission: 01/12/2022 Delivery date:01/13/2022  Delivering provider: Julianne Handler  Date of discharge: 01/13/2022  Admitting diagnosis: Chronic hypertension affecting pregnancy [O10.919] Intrauterine pregnancy: [redacted]w[redacted]d    Secondary diagnosis:  Principal Problem:   Chronic hypertension affecting pregnancy Active Problems:   SVD (spontaneous vaginal delivery)  Additional problems: ***    Discharge diagnosis: Preterm Pregnancy Delivered and CHTN                                              Post partum procedures:{Postpartum procedures:23558} Augmentation: {{ELYHTMBPJPET:62446}Complications: {OB Labor/Delivery Complications:20784}  Hospital course: {Courses:23701}  Magnesium Sulfate received: {Mag received:30440022} BMZ received: {BMZ received:30440023} Rhophylac:{Rhophylac received:30440032} MMR:{MMR:30440033} T-DaP:{Tdap:23962} Flu: {{XFQ:72257}Transfusion:{Transfusion received:30440034}  Physical exam  Vitals:   01/13/22 0445 01/13/22 0500 01/13/22 0515 01/13/22 0615  BP: (!) 131/93 126/86 126/84 134/82  Pulse: 84 72 72 68  Resp:      Temp:    98.1 F (36.7 C)  TempSrc:    Oral  SpO2:    100%  Weight:      Height:       General: {Exam; general:21111117} Lochia: {Desc; appropriate/inappropriate:30686::"appropriate"} Uterine Fundus: {Desc; firm/soft:30687} Incision: {Exam; incision:21111123} DVT Evaluation: {Exam; dvt:2111122} Labs: Lab Results  Component Value Date   WBC 8.1 01/12/2022   HGB 10.1 (L) 01/12/2022   HCT 31.2 (L) 01/12/2022   MCV 87.2 01/12/2022   PLT 234 01/12/2022      Latest Ref Rng & Units 01/12/2022    4:36 AM  CMP  Glucose 70 - 99 mg/dL 72    BUN 6 - 20 mg/dL <5    Creatinine 0.44 - 1.00 mg/dL 0.47    Sodium 135 - 145 mmol/L 137    Potassium 3.5 - 5.1 mmol/L 3.4    Chloride 98 - 111 mmol/L 108     CO2 22 - 32 mmol/L 21    Calcium 8.9 - 10.3 mg/dL 8.7    Total Protein 6.5 - 8.1 g/dL 6.7    Total Bilirubin 0.3 - 1.2 mg/dL 0.3    Alkaline Phos 38 - 126 U/L 88    AST 15 - 41 U/L 17    ALT 0 - 44 U/L 11     Edinburgh Score:    03/24/2018    1:29 PM  Edinburgh Postnatal Depression Scale Screening Tool  I have been able to laugh and see the funny side of things. 0  I have looked forward with enjoyment to things. 0  I have blamed myself unnecessarily when things went wrong. 0  I have been anxious or worried for no good reason. 0  I have felt scared or panicky for no good reason. 0  Things have been getting on top of me. 0  I have been so unhappy that I have had difficulty sleeping. 0  I have felt sad or miserable. 0  I have been so unhappy that I have been crying. 0  The thought of harming myself has occurred to me. 0  Edinburgh Postnatal Depression Scale Total 0     After visit meds:  Allergies as of 01/13/2022   No Known Allergies   [facility] must [facility] completed [facility]  Discharge home in stable condition Infant Feeding: {Baby feeding:23562} Infant Disposition:{CHL IP OB HOME WITH GTXMIW:80321} Discharge instruction: per After Visit Summary and Postpartum booklet. Activity: Advance as tolerated. Pelvic rest for 6 weeks.  Diet: {OB YYQM:25003704} Future Appointments:No future appointments. Follow up Visit:   Please schedule this patient for a {Visit type:23955} postpartum visit in {Postpartum visit:23953} with the following provider: {Provider type:23954}. Additional Postpartum F/U:{PP Procedure:23957}  {Risk UGQBV:69450} pregnancy complicated by: {TUUEKCMKLKJZ:79150} Delivery mode:  Vaginal, Spontaneous  Anticipated Birth Control:  {Birth Control:23956}   01/13/2022 Julianne Handler, CNM

## 2022-01-13 NOTE — Lactation Note (Signed)
This note was copied from a baby's chart. Lactation Consultation Note  Patient Name: Sydney Henry FUXNA'T Date: 01/13/2022 Reason for consult: Initial assessment;Exclusive pumping and bottle feeding;Late-preterm 34-36.6wks Age:29 hours  LC in to visit with P3 Mom of LPTI weighing >6 lbs at birth.  Mom states she doesn't want to latch baby to the breast, but does want to pump and bottle feed.    Reviewed breast massage and hand expression, Mom with sensitive nipples and LC only briefly demonstrated.  Unable to express colostrum at this time.   LC set up DEBP and assisted Mom to pump for first time.  24 mm flanges are a good fit.    LPTI guidelines provided and encouraged STS with baby.   Encouraged feeding baby every 3 hrs or sooner if baby is cueing.  Mom encouraged to pump both breasts 15 mins on initiation setting with each feeding.  Mom encouraged to ask for help prn.  Maternal Data Has patient been taught Hand Expression?: Yes Does the patient have breastfeeding experience prior to this delivery?: No  Feeding Mother's Current Feeding Choice: Breast Milk and Formula   Lactation Tools Discussed/Used Tools: Pump;Flanges;Bottle Flange Size: 24 Breast pump type: Double-Electric Breast Pump;Manual Pump Education: Setup, frequency, and cleaning;Milk Storage Reason for Pumping: Support milk supply/Mom desires to pump and bottle feed Pumping frequency: Mom to pump both breasts on initiation setting with each feeding  Interventions Interventions: Breast feeding basics reviewed;Skin to skin;Breast massage;Hand express;DEBP;Hand pump;Education;Pace feeding;LC Services brochure;LPT handout/interventions  Discharge Pump: Personal [employer])  Consult Status Consult Status: Follow-up Date: 01/14/22 Follow-up type: In-patient    Sydney Henry 01/13/2022, 9:52 AM

## 2022-01-14 ENCOUNTER — Other Ambulatory Visit: Payer: Self-pay

## 2022-01-14 ENCOUNTER — Encounter: Payer: Self-pay | Admitting: Obstetrics

## 2022-01-14 ENCOUNTER — Other Ambulatory Visit (HOSPITAL_COMMUNITY): Payer: Self-pay

## 2022-01-14 ENCOUNTER — Inpatient Hospital Stay (HOSPITAL_COMMUNITY): Payer: Medicaid Other | Admitting: Anesthesiology

## 2022-01-14 ENCOUNTER — Encounter (HOSPITAL_COMMUNITY): Payer: Self-pay | Admitting: Family Medicine

## 2022-01-14 ENCOUNTER — Ambulatory Visit: Payer: Self-pay

## 2022-01-14 ENCOUNTER — Encounter (HOSPITAL_COMMUNITY)
Admission: AD | Disposition: A | Discharge status: Home / Self Care | Payer: Self-pay | Source: Home / Self Care | Attending: Obstetrics and Gynecology

## 2022-01-14 ENCOUNTER — Other Ambulatory Visit: Payer: Self-pay | Admitting: Obstetrics

## 2022-01-14 ENCOUNTER — Encounter (HOSPITAL_COMMUNITY): Payer: Self-pay

## 2022-01-14 ENCOUNTER — Inpatient Hospital Stay (HOSPITAL_COMMUNITY): Payer: Medicaid Other

## 2022-01-14 DIAGNOSIS — Z302 Encounter for sterilization: Secondary | ICD-10-CM

## 2022-01-14 HISTORY — PX: TUBAL LIGATION: SHX77

## 2022-01-14 SURGERY — LIGATION, FALLOPIAN TUBE, POSTPARTUM
Anesthesia: Spinal | Laterality: Bilateral

## 2022-01-14 MED ORDER — ACETAMINOPHEN 160 MG/5ML PO SOLN: 1000.0000 mg | Freq: Once | ORAL | Status: DC

## 2022-01-14 MED ORDER — MIDAZOLAM HCL 2 MG/2ML IJ SOLN
INTRAMUSCULAR | Status: AC
  Filled 2022-01-14: qty 2

## 2022-01-14 MED ORDER — ROCURONIUM BROMIDE 100 MG/10ML IV SOLN
INTRAVENOUS | Status: DC | PRN
  Administered 2022-01-14: 10 mg via INTRAVENOUS

## 2022-01-14 MED ORDER — FUROSEMIDE 20 MG PO TABS
20.0000 mg | ORAL_TABLET | Freq: Every day | ORAL | 0 refills | Status: DC
  Filled 2022-01-14: qty 5, 5d supply, fill #0

## 2022-01-14 MED ORDER — MIDAZOLAM HCL 2 MG/2ML IJ SOLN
INTRAMUSCULAR | Status: DC | PRN
  Administered 2022-01-14: 2 mg via INTRAVENOUS

## 2022-01-14 MED ORDER — IBUPROFEN 600 MG PO TABS
600.0000 mg | ORAL_TABLET | Freq: Four times a day (QID) | ORAL | 0 refills | Status: DC
  Filled 2022-01-14: qty 30, 8d supply, fill #0

## 2022-01-14 MED ORDER — BUPIVACAINE HCL (PF) 0.25 % IJ SOLN
INTRAMUSCULAR | Status: AC
  Filled 2022-01-14: qty 20

## 2022-01-14 MED ORDER — METOCLOPRAMIDE HCL 10 MG PO TABS
10.0000 mg | ORAL_TABLET | Freq: Once | ORAL | Status: AC
  Administered 2022-01-14: 10 mg via ORAL
  Filled 2022-01-14: qty 1

## 2022-01-14 MED ORDER — PROPOFOL 10 MG/ML IV BOLUS
INTRAVENOUS | Status: DC | PRN
Start: 2022-01-14 — End: 2022-01-14
  Administered 2022-01-14: 100 mg via INTRAVENOUS

## 2022-01-14 MED ORDER — DEXAMETHASONE SODIUM PHOSPHATE 10 MG/ML IJ SOLN
INTRAMUSCULAR | Status: AC
  Filled 2022-01-14: qty 1

## 2022-01-14 MED ORDER — LACTATED RINGERS IV SOLN
INTRAVENOUS | Status: DC | PRN
Start: 2022-01-14 — End: 2022-01-14

## 2022-01-14 MED ORDER — KETOROLAC TROMETHAMINE 30 MG/ML IJ SOLN
30.0000 mg | Freq: Once | INTRAMUSCULAR | Status: AC | PRN
  Administered 2022-01-14: 30 mg via INTRAVENOUS

## 2022-01-14 MED ORDER — FENTANYL CITRATE (PF) 100 MCG/2ML IJ SOLN: 25.0000 ug | INTRAMUSCULAR | Status: DC | PRN

## 2022-01-14 MED ORDER — ACETAMINOPHEN 325 MG PO TABS
650.0000 mg | ORAL_TABLET | Freq: Four times a day (QID) | ORAL | 0 refills | Status: AC | PRN
  Filled 2022-01-14: qty 60, 8d supply, fill #0

## 2022-01-14 MED ORDER — LIDOCAINE HCL (CARDIAC) PF 100 MG/5ML IV SOSY
PREFILLED_SYRINGE | INTRAVENOUS | Status: DC | PRN
Start: 2022-01-14 — End: 2022-01-14
  Administered 2022-01-14: 60 mg via INTRAVENOUS

## 2022-01-14 MED ORDER — ACETAMINOPHEN 500 MG PO TABS: 1000.0000 mg | ORAL_TABLET | Freq: Once | ORAL | Status: DC

## 2022-01-14 MED ORDER — KETOROLAC TROMETHAMINE 30 MG/ML IJ SOLN: 30.0000 mg | Freq: Once | INTRAMUSCULAR | Status: DC

## 2022-01-14 MED ORDER — FENTANYL CITRATE (PF) 250 MCG/5ML IJ SOLN
INTRAMUSCULAR | Status: DC | PRN
Start: 2022-01-14 — End: 2022-01-14
  Administered 2022-01-14: 50 ug via INTRAVENOUS
  Administered 2022-01-14 (×2): 100 ug via INTRAVENOUS

## 2022-01-14 MED ORDER — DROPERIDOL 2.5 MG/ML IJ SOLN: 0.6250 mg | Freq: Once | INTRAMUSCULAR | Status: DC | PRN

## 2022-01-14 MED ORDER — ONDANSETRON HCL 4 MG/2ML IJ SOLN
INTRAMUSCULAR | Status: AC
  Filled 2022-01-14: qty 2

## 2022-01-14 MED ORDER — NIFEDIPINE ER 30 MG PO TB24
30.0000 mg | ORAL_TABLET | Freq: Every day | ORAL | 1 refills | Status: DC
  Filled 2022-01-14: qty 30, 30d supply, fill #0

## 2022-01-14 MED ORDER — BUPIVACAINE HCL 0.5 % IJ SOLN
INTRAMUSCULAR | Status: DC | PRN
  Administered 2022-01-14: 20 mL

## 2022-01-14 MED ORDER — LACTATED RINGERS IV SOLN: INTRAVENOUS | Status: DC

## 2022-01-14 MED ORDER — ROCURONIUM BROMIDE 10 MG/ML (PF) SYRINGE
PREFILLED_SYRINGE | INTRAVENOUS | Status: AC
  Filled 2022-01-14: qty 10

## 2022-01-14 MED ORDER — FAMOTIDINE 20 MG PO TABS
40.0000 mg | ORAL_TABLET | Freq: Once | ORAL | Status: AC
  Administered 2022-01-14: 40 mg via ORAL
  Filled 2022-01-14: qty 2

## 2022-01-14 MED ORDER — DEXAMETHASONE SODIUM PHOSPHATE 4 MG/ML IJ SOLN
INTRAMUSCULAR | Status: DC | PRN
Start: 2022-01-14 — End: 2022-01-14
  Administered 2022-01-14: 8 mg via INTRAVENOUS

## 2022-01-14 MED ORDER — OXYCODONE HCL 5 MG PO TABS
5.0000 mg | ORAL_TABLET | Freq: Four times a day (QID) | ORAL | Status: DC | PRN
Start: 2022-01-14 — End: 2022-01-14

## 2022-01-14 MED ORDER — SUCCINYLCHOLINE CHLORIDE 200 MG/10ML IV SOSY
PREFILLED_SYRINGE | INTRAVENOUS | Status: AC
  Filled 2022-01-14: qty 10

## 2022-01-14 MED ORDER — PROPOFOL 10 MG/ML IV BOLUS
INTRAVENOUS | Status: AC
  Filled 2022-01-14: qty 20

## 2022-01-14 MED ORDER — SUCCINYLCHOLINE CHLORIDE 200 MG/10ML IV SOSY
PREFILLED_SYRINGE | INTRAVENOUS | Status: DC | PRN
  Administered 2022-01-14: 190 mg via INTRAVENOUS

## 2022-01-14 MED ORDER — FENTANYL CITRATE (PF) 250 MCG/5ML IJ SOLN
INTRAMUSCULAR | Status: AC
  Filled 2022-01-14: qty 5

## 2022-01-14 MED ORDER — NIFEDIPINE ER OSMOTIC RELEASE 30 MG PO TB24
30.0000 mg | ORAL_TABLET | Freq: Every day | ORAL | Status: DC
  Filled 2022-01-14: qty 1

## 2022-01-14 MED ORDER — OXYCODONE HCL 5 MG PO TABS
5.0000 mg | ORAL_TABLET | Freq: Four times a day (QID) | ORAL | 0 refills | Status: DC | PRN
  Filled 2022-01-14: qty 5, 2d supply, fill #0

## 2022-01-14 MED ORDER — METOCLOPRAMIDE HCL 5 MG/ML IJ SOLN
INTRAMUSCULAR | Status: DC | PRN
  Administered 2022-01-14: 5 mg via INTRAVENOUS

## 2022-01-14 MED ORDER — ONDANSETRON HCL 4 MG/2ML IJ SOLN
INTRAMUSCULAR | Status: DC | PRN
  Administered 2022-01-14: 4 mg via INTRAVENOUS

## 2022-01-14 MED ORDER — KETOROLAC TROMETHAMINE 30 MG/ML IJ SOLN
INTRAMUSCULAR | Status: AC
  Filled 2022-01-14: qty 1

## 2022-01-14 SURGICAL SUPPLY — 29 items
CLOTH BEACON ORANGE TIMEOUT ST (SAFETY) ×2 IMPLANT
DERMABOND ADHESIVE PROPEN (GAUZE/BANDAGES/DRESSINGS) ×1
DERMABOND ADVANCED (GAUZE/BANDAGES/DRESSINGS) ×1
DERMABOND ADVANCED .7 DNX12 (GAUZE/BANDAGES/DRESSINGS) ×1 IMPLANT
DERMABOND ADVANCED .7 DNX6 (GAUZE/BANDAGES/DRESSINGS) IMPLANT
DRSG OPSITE POSTOP 3X4 (GAUZE/BANDAGES/DRESSINGS) ×2 IMPLANT
DURAPREP 26ML APPLICATOR (WOUND CARE) ×2 IMPLANT
ELECT REM PT RETURN 9FT ADLT (ELECTROSURGICAL) ×2
ELECTRODE REM PT RTRN 9FT ADLT (ELECTROSURGICAL) ×1 IMPLANT
GLOVE BIO SURGEON STRL SZ7 (GLOVE) ×2 IMPLANT
GLOVE BIOGEL PI IND STRL 7.0 (GLOVE) ×1 IMPLANT
GLOVE BIOGEL PI IND STRL 7.5 (GLOVE) ×1 IMPLANT
GLOVE BIOGEL PI INDICATOR 7.0 (GLOVE) ×1
GLOVE BIOGEL PI INDICATOR 7.5 (GLOVE) ×1
GOWN STRL REUS W/TWL LRG LVL3 (GOWN DISPOSABLE) ×4 IMPLANT
NEEDLE HYPO 22GX1.5 SAFETY (NEEDLE) ×2 IMPLANT
NS IRRIG 1000ML POUR BTL (IV SOLUTION) ×2 IMPLANT
PACK ABDOMINAL MINOR (CUSTOM PROCEDURE TRAY) ×2 IMPLANT
PENCIL BUTTON HOLSTER BLD 10FT (ELECTRODE) IMPLANT
PROTECTOR NERVE ULNAR (MISCELLANEOUS) ×2 IMPLANT
SPONGE LAP 4X18 RFD (DISPOSABLE) ×2 IMPLANT
SUT MNCRL AB 4-0 PS2 18 (SUTURE) ×2 IMPLANT
SUT PLAIN 2 0 (SUTURE)
SUT PLAIN ABS 2-0 CT1 27XMFL (SUTURE) IMPLANT
SUT VICRYL 0 TIES 12 18 (SUTURE) IMPLANT
SUT VICRYL 0 UR6 27IN ABS (SUTURE) ×2 IMPLANT
SYR CONTROL 10ML LL (SYRINGE) ×2 IMPLANT
TOWEL OR 17X24 6PK STRL BLUE (TOWEL DISPOSABLE) ×4 IMPLANT
TRAY FOLEY CATH SILVER 14FR (SET/KITS/TRAYS/PACK) ×2 IMPLANT

## 2022-01-14 NOTE — Op Note (Cosign Needed Addendum)
Sydney Henry 01/12/2022 - 01/14/2022  PREOPERATIVE DIAGNOSIS:  Undesired fertility  POSTOPERATIVE DIAGNOSIS:  Undesired fertility  PROCEDURE:  Postpartum Bilateral Tubal Salpingectomy   SURGEON:  Dr Candelaria Celeste  ASSISTANT: Dr. Warner Mccreedy  ANESTHESIA:  General anesthesia  COMPLICATIONS:  None immediate.  ESTIMATED BLOOD LOSS:  Less than 20cc.  FLUIDS: 1000 mL LR.  URINE OUTPUT:  400cc in foley catheter placed after case and removed in PACU  INDICATIONS: 31 y.o. yo [id number]  with undesired fertility,status post vaginal delivery, desires permanent sterilization. Risks and benefits of procedure discussed with patient including permanence of method, bleeding, infection, injury to surrounding organs and need for additional procedures. Risk failure of 0.5-1% with increased risk of ectopic gestation if pregnancy occurs was also discussed with patient.   FINDINGS:  Normal uterus, tubes, and ovaries.  TECHNIQUE:  The patient was taken to the operating room where she was placed under general anesthesia.  She was then placed in the dorsal supine position and prepped and draped in sterile fashion.  After an adequate timeout was performed, attention was turned to the patient's abdomen where a small transverse skin incision was made under the umbilical fold. The incision was taken down to the layer of fascia using the scalpel, and fascia was incised, and extended bilaterally using Mayo scissors. The peritoneum was entered in a sharp fashion. Attention was then turned to the patient's uterus, and right fallopian tube was identified and followed out to the fimbriated end.     The patient's right  fallopian tube was then identified, and the Babcock clamp was then used to grasp the tube. A ligasure device was then utilized to cauterize and then remove approximately ~4-5 cm segment of the tube (done in three different segments). Excellent hemostasis noted. The left fallopian tube was then identified and  the same procedure was carried through on the left side.  Good hemostasis was noted overall.  The instruments were then removed from the patient's abdomen and the fascial incision was repaired with 0 Vicryl. At that time 20cc total of 0.25% marcaine was injected (10cc on each lateral edge of fascial incision). The skin was closed with a 3-0 vicryl subcuticular stitch. The patient tolerated the procedure well.  Sponge, lap, and needle counts were correct times two.  The patient was then taken to the recovery room awake, extubated and in stable condition.   Warner Mccreedy, MD, MPH OB Fellow, [facility]

## 2022-01-14 NOTE — Lactation Note (Signed)
This note was copied from a baby's chart. Lactation Consultation Note Mom chooses to formula feeding.  Patient Name: Boy Tang Rathje [id number] Date: 01/14/2022   Age:29 hours  Maternal Data    Feeding Nipple Type: Slow - flow  LATCH Score                    Lactation Tools Discussed/Used    Interventions    Discharge    Consult Status Consult Status: Complete    Theodoro Kalata 01/14/2022, 7:42 PM

## 2022-01-14 NOTE — Transfer of Care (Signed)
Immediate Anesthesia Transfer of Care Note  Patient: Sydney Henry  Procedure(s) Performed: POST PARTUM TUBAL LIGATION (Bilateral)  Patient Location: PACU  Anesthesia Type:General  Level of Consciousness: awake, alert  and oriented  Airway & Oxygen Therapy: Patient Spontanous Breathing  Post-op Assessment: Report given to RN and Post -op Vital signs reviewed and stable  Post vital signs: Reviewed and stable  Last Vitals:  Vitals Value Taken Time  BP 137/84 01/14/22 1023  Temp    Pulse 74 01/14/22 1027  Resp 17 01/14/22 1027  SpO2 96 % 01/14/22 1027  Vitals shown include unvalidated device data.  Last Pain:  Vitals:   01/14/22 0613  TempSrc: Oral  PainSc: 0-No pain      Patients Stated Pain Goal: 0 (01/13/22 1736)  Complications: No notable events documented.

## 2022-01-14 NOTE — Anesthesia Postprocedure Evaluation (Signed)
Anesthesia Post Note  Patient: Sydney Henry  Procedure(s) Performed: POST PARTUM TUBAL LIGATION (Bilateral)     Patient location during evaluation: PACU Anesthesia Type: General Level of consciousness: awake and alert Pain management: pain level controlled Vital Signs Assessment: post-procedure vital signs reviewed and stable Respiratory status: spontaneous breathing, nonlabored ventilation, respiratory function stable and patient connected to nasal cannula oxygen Cardiovascular status: blood pressure returned to baseline and stable Postop Assessment: no apparent nausea or vomiting Anesthetic complications: no   No notable events documented.  Last Vitals:  Vitals:   01/14/22 1130 01/14/22 1141  BP: 129/83 (!) 139/91  Pulse: 62 (!) 59  Resp: (!) 21 18  Temp:  36.6 C  SpO2: 100% 100%    Last Pain:  Vitals:   01/14/22 1141  TempSrc: Axillary  PainSc:    Pain Goal: Patients Stated Pain Goal: 0 (01/13/22 1736)              Epidural/Spinal Function Cutaneous sensation: Able to Wiggle Toes (01/14/22 1130), Patient able to flex knees: Yes (01/14/22 1130), Patient able to lift hips off bed: No (01/14/22 1130), Back pain beyond tenderness at insertion site: No (01/14/22 1130), Progressively worsening motor and/or sensory loss: No (01/14/22 1130), Bowel and/or bladder incontinence post epidural: No (01/14/22 1130)  Antha Niday L Kipling Graser
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
## 2022-01-16 LAB — SURGICAL PATHOLOGY

## 2022-01-22 ENCOUNTER — Telehealth (HOSPITAL_COMMUNITY): Payer: Self-pay | Admitting: *Deleted

## 2022-01-22 NOTE — Telephone Encounter (Signed)
Left phone voicemail message.  Duffy Rhody, RN 01-22-2022 at 12:48pm

## 2022-02-18 ENCOUNTER — Telehealth: Payer: Self-pay

## 2022-02-20 ENCOUNTER — Ambulatory Visit: Payer: Medicaid Other | Admitting: Obstetrics

## 2022-03-05 ENCOUNTER — Ambulatory Visit: Payer: Medicaid Other | Admitting: Family Medicine

## 2022-04-09 ENCOUNTER — Encounter: Payer: Self-pay | Admitting: Obstetrics and Gynecology

## 2022-04-09 ENCOUNTER — Telehealth (INDEPENDENT_AMBULATORY_CARE_PROVIDER_SITE_OTHER): Payer: Medicaid Other | Admitting: Obstetrics and Gynecology

## 2022-04-09 DIAGNOSIS — Z9079 Acquired absence of other genital organ(s): Secondary | ICD-10-CM | POA: Diagnosis not present

## 2022-04-09 NOTE — Progress Notes (Signed)
  I connected with  [name] on 04/09/22 by a video enabled telemedicine application and verified that I am speaking with the correct person using two identifiers.   I discussed the limitations of evaluation and management by telemedicine. The patient expressed understanding and agreed to proceed.   Post Partum Visit Note  Sydney Henry is a 31 y.o. [id number] female who presents for a postpartum visit. She is  11  weeks postpartum following a normal spontaneous vaginal delivery.  I have fully reviewed the prenatal and intrapartum course. The delivery was at 36 gestational weeks.  Anesthesia: epidural. Postpartum course has been unremarkable. Baby is doing well. Baby is feeding by bottle - Gerber Goodstart Gentle . Bleeding no bleeding. Bowel function is normal. Bladder function is normal. Patient is sexually active. Contraception method is tubal ligation. Postpartum depression screening: negative.   The pregnancy intention screening data noted above was reviewed. Potential methods of contraception were discussed. The patient elected to proceed with No data recorded.    Health Maintenance Due  Topic Date Due   COVID-19 Vaccine (1) Never done   INFLUENZA VACCINE  03/25/2022    Medical Records reviewed  Review of Systems Pertinent items noted in HPI and remainder of comprehensive ROS otherwise negative.        Assessment:    There are no diagnoses linked to this encounter.  Nl postpartum exam.   Plan:   Essential components of care per ACOG recommendations:  1.  Mood and well being: Patient with negative depression screening today. Reviewed local resources for support.  - Patient tobacco use? No.   - hx of drug use? No.    2. Infant care and feeding:  -Patient currently breastmilk feeding? No.  -Social determinants of health (SDOH) reviewed in EPIC.   3. Sexuality, contraception and birth spacing - Patient does not want a pregnancy in the next year.  Desired family size  is completed  - Reviewed reproductive life planning. Reviewed contraceptive methods based on pt preferences and effectiveness.  Patient desired Female Sterilization today.   - Discussed birth spacing of 18 months  4. Sleep and fatigue -Encouraged family/partner/community support of 4 hrs of uninterrupted sleep to help with mood and fatigue  5. Physical Recovery  - Discussed patients delivery and complications. She describes her labor as good. - Patient had a Vaginal, no problems at delivery. Patient had a 1st degree laceration. Perineal healing reviewed. Patient expressed understanding - Patient has urinary incontinence? No. - Patient is safe to resume physical and sexual activity  6.  Health Maintenance - HM due items addressed Yes - Last pap smear  Diagnosis  Date Value Ref Range Status  08/20/2021 (A)  Final   - Atypical squamous cells of undetermined significance (ASC-US)   Pap smear not done at today's visit.  -Breast Cancer screening indicated? No.   7. Chronic Disease/Pregnancy Condition follow up: None    Nettie Elm, MD [facility], [facility]

## 2022-04-09 NOTE — Patient Instructions (Signed)

## 2022-10-10 ENCOUNTER — Emergency Department (HOSPITAL_COMMUNITY)
Admission: EM | Admit: 2022-10-10 | Discharge: 2022-10-10 | Disposition: A | Discharge status: Home / Self Care | Payer: Medicaid Other | Attending: Emergency Medicine | Admitting: Emergency Medicine

## 2022-10-10 ENCOUNTER — Emergency Department (HOSPITAL_COMMUNITY): Payer: Medicaid Other

## 2022-10-10 DIAGNOSIS — Y908 Blood alcohol level of 240 mg/100 ml or more: Secondary | ICD-10-CM | POA: Diagnosis not present

## 2022-10-10 DIAGNOSIS — J45909 Unspecified asthma, uncomplicated: Secondary | ICD-10-CM | POA: Diagnosis not present

## 2022-10-10 DIAGNOSIS — F10120 Alcohol abuse with intoxication, uncomplicated: Secondary | ICD-10-CM | POA: Insufficient documentation

## 2022-10-10 DIAGNOSIS — I10 Essential (primary) hypertension: Secondary | ICD-10-CM | POA: Insufficient documentation

## 2022-10-10 DIAGNOSIS — Z1152 Encounter for screening for COVID-19: Secondary | ICD-10-CM | POA: Insufficient documentation

## 2022-10-10 DIAGNOSIS — F10920 Alcohol use, unspecified with intoxication, uncomplicated: Secondary | ICD-10-CM

## 2022-10-10 LAB — COMPREHENSIVE METABOLIC PANEL
ALT: 16 U/L (ref 0–44)
AST: 34 U/L (ref 15–41)
Albumin: 4.3 g/dL (ref 3.5–5.0)
Alkaline Phosphatase: 62 U/L (ref 38–126)
Anion gap: 16 — ABNORMAL HIGH (ref 5–15)
BUN: 12 mg/dL (ref 6–20)
CO2: 20 mmol/L — ABNORMAL LOW (ref 22–32)
Calcium: 9.3 mg/dL (ref 8.9–10.3)
Chloride: 106 mmol/L (ref 98–111)
Creatinine, Ser: 0.87 mg/dL (ref 0.44–1.00)
GFR, Estimated: >60 mL/min (ref >60)
Glucose, Bld: 82 mg/dL (ref 70–99)
Potassium: 4.2 mmol/L (ref 3.5–5.1)
Sodium: 142 mmol/L (ref 135–145)
Total Bilirubin: 0.8 mg/dL (ref 0.3–1.2)
Total Protein: 8.2 g/dL — ABNORMAL HIGH (ref 6.5–8.1)

## 2022-10-10 LAB — RESP PANEL BY RT-PCR (RSV, FLU A&B, COVID)  RVPGX2
Influenza A by PCR: NEGATIVE
Influenza B by PCR: NEGATIVE
Resp Syncytial Virus by PCR: NEGATIVE
SARS Coronavirus 2 by RT PCR: NEGATIVE

## 2022-10-10 LAB — CBC WITH DIFFERENTIAL/PLATELET
Abs Immature Granulocytes: 0.01 10*3/uL (ref 0.00–0.07)
Basophils Absolute: 0 10*3/uL (ref 0.0–0.1)
Basophils Relative: 1 %
Eosinophils Absolute: 0.1 10*3/uL (ref 0.0–0.5)
Eosinophils Relative: 3 %
HCT: 29.4 % — ABNORMAL LOW (ref 36.0–46.0)
Hemoglobin: 9 g/dL — ABNORMAL LOW (ref 12.0–15.0)
Immature Granulocytes: 0 %
Lymphocytes Relative: 47 %
Lymphs Abs: 2.1 10*3/uL (ref 0.7–4.0)
MCH: 25.9 pg — ABNORMAL LOW (ref 26.0–34.0)
MCHC: 30.6 g/dL (ref 30.0–36.0)
MCV: 84.7 fL (ref 80.0–100.0)
Monocytes Absolute: 0.3 10*3/uL (ref 0.1–1.0)
Monocytes Relative: 7 %
Neutro Abs: 1.8 10*3/uL (ref 1.7–7.7)
Neutrophils Relative %: 42 %
Platelets: 200 10*3/uL (ref 150–400)
RBC: 3.47 MIL/uL — ABNORMAL LOW (ref 3.87–5.11)
RDW: 15.9 % — ABNORMAL HIGH (ref 11.5–15.5)
WBC: 4.3 10*3/uL (ref 4.0–10.5)
nRBC: 0 % (ref 0.0–0.2)

## 2022-10-10 LAB — RAPID URINE DRUG SCREEN, HOSP PERFORMED
Amphetamines: NOT DETECTED
Barbiturates: NOT DETECTED
Benzodiazepines: NOT DETECTED
Cocaine: NOT DETECTED
Opiates: NOT DETECTED
Tetrahydrocannabinol: POSITIVE — AB

## 2022-10-10 LAB — ACETAMINOPHEN LEVEL: Acetaminophen (Tylenol), Serum: <10 ug/mL — ABNORMAL LOW (ref 10–30)

## 2022-10-10 LAB — SALICYLATE LEVEL: Salicylate Lvl: <7 mg/dL — ABNORMAL LOW (ref 7.0–30.0)

## 2022-10-10 LAB — ETHANOL: Alcohol, Ethyl (B): 399 mg/dL (ref <10)

## 2022-10-10 LAB — I-STAT BETA HCG BLOOD, ED (MC, WL, AP ONLY): I-stat hCG, quantitative: <5 m[IU]/mL (ref <5)

## 2022-10-10 LAB — CBG MONITORING, ED: Glucose-Capillary: 90 mg/dL (ref 70–99)

## 2022-10-10 MED ORDER — SODIUM CHLORIDE 0.9 % IV BOLUS
500.0000 mL | Freq: Once | INTRAVENOUS | Status: AC
Start: 2022-10-10 — End: 2022-10-10
  Administered 2022-10-10: 500 mL via INTRAVENOUS

## 2022-10-10 MED ORDER — ONDANSETRON HCL 4 MG/2ML IJ SOLN
4.0000 mg | Freq: Once | INTRAMUSCULAR | Status: AC
  Administered 2022-10-10: 4 mg via INTRAVENOUS
  Filled 2022-10-10: qty 2

## 2022-10-10 MED ORDER — SODIUM CHLORIDE 0.9 % IV SOLN: INTRAVENOUS | Status: DC
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
## 2022-10-10 NOTE — ED Notes (Signed)
Pt appears to be resting, calm, laying right lateral, warm blanket applied to pt, head position on pillow for support, NAD noted, observed even RR and unlabored, side rails up x2 for safety, plan of care ongoing. Pt remains on cardiac monitoring devices, pulse ox, and BP intermittent readings.

## 2022-10-10 NOTE — ED Notes (Signed)
Pt vomited while in CT, when return from room, pt was cleaned from emesis, compete linen change, new gown applied, and a brief placed on pt. Pt reposition in bed, airway patent, and warm blankets given. Pt remains on cardiac monitoring devices. Dr. Randal Buba aware pt vomited, order for CXR
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
## 2022-10-10 NOTE — ED Notes (Signed)
Dr. Randal Buba at the bedside to speak with pt's mother, pt to CT scanner at this time with radiology
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
## 2022-10-10 NOTE — ED Notes (Signed)
Mother at bedside, update given
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
